# Patient Record
Sex: Female | Born: 1970 | Race: White | Hispanic: No | Marital: Married | Smoking: Never smoker
Health system: Southern US, Community
[De-identification: ages and names within clinical notes are randomized; demographics above are authoritative.]

## PROBLEM LIST (undated history)

## (undated) ENCOUNTER — Inpatient Hospital Stay (HOSPITAL_COMMUNITY): Payer: Self-pay

## (undated) DIAGNOSIS — C50919 Malignant neoplasm of unspecified site of unspecified female breast: Secondary | ICD-10-CM

## (undated) DIAGNOSIS — Z8052 Family history of malignant neoplasm of bladder: Secondary | ICD-10-CM

## (undated) DIAGNOSIS — Z800 Family history of malignant neoplasm of digestive organs: Secondary | ICD-10-CM

## (undated) DIAGNOSIS — Z803 Family history of malignant neoplasm of breast: Secondary | ICD-10-CM

## (undated) DIAGNOSIS — Z808 Family history of malignant neoplasm of other organs or systems: Secondary | ICD-10-CM

## (undated) DIAGNOSIS — Z923 Personal history of irradiation: Secondary | ICD-10-CM

## (undated) HISTORY — DX: Family history of malignant neoplasm of digestive organs: Z80.0

## (undated) HISTORY — PX: OTHER SURGICAL HISTORY: SHX169

## (undated) HISTORY — DX: Family history of malignant neoplasm of other organs or systems: Z80.8

## (undated) HISTORY — DX: Family history of malignant neoplasm of bladder: Z80.52

## (undated) HISTORY — DX: Family history of malignant neoplasm of breast: Z80.3

---

## 2001-07-11 HISTORY — PX: BREAST EXCISIONAL BIOPSY: SUR124

## 2002-08-16 ENCOUNTER — Ambulatory Visit (HOSPITAL_COMMUNITY): Admission: RE | Admit: 2002-08-16 | Discharge: 2002-08-16 | Payer: Self-pay | Admitting: Obstetrics and Gynecology

## 2002-11-01 ENCOUNTER — Inpatient Hospital Stay (HOSPITAL_COMMUNITY): Admission: AD | Admit: 2002-11-01 | Discharge: 2002-11-04 | Payer: Self-pay | Admitting: Obstetrics and Gynecology

## 2002-11-05 ENCOUNTER — Encounter: Admission: RE | Admit: 2002-11-05 | Discharge: 2002-12-05 | Payer: Self-pay | Admitting: Obstetrics and Gynecology

## 2003-01-06 ENCOUNTER — Other Ambulatory Visit: Admission: RE | Admit: 2003-01-06 | Discharge: 2003-01-06 | Payer: Self-pay | Admitting: Obstetrics and Gynecology

## 2004-02-02 ENCOUNTER — Other Ambulatory Visit: Admission: RE | Admit: 2004-02-02 | Discharge: 2004-02-02 | Payer: Self-pay | Admitting: Obstetrics and Gynecology

## 2004-05-22 ENCOUNTER — Emergency Department (HOSPITAL_COMMUNITY): Admission: EM | Admit: 2004-05-22 | Discharge: 2004-05-22 | Payer: Self-pay | Admitting: Emergency Medicine

## 2005-03-04 ENCOUNTER — Other Ambulatory Visit: Admission: RE | Admit: 2005-03-04 | Discharge: 2005-03-04 | Payer: Self-pay | Admitting: Obstetrics and Gynecology

## 2010-06-11 ENCOUNTER — Inpatient Hospital Stay (HOSPITAL_COMMUNITY): Admission: AD | Admit: 2010-06-11 | Discharge: 2010-06-13 | Payer: Self-pay | Admitting: Obstetrics and Gynecology

## 2010-09-20 LAB — CBC
HCT: 34.7 % — ABNORMAL LOW (ref 36.0–46.0)
HCT: 36.8 % (ref 36.0–46.0)
Hemoglobin: 12 g/dL (ref 12.0–15.0)
Hemoglobin: 12.7 g/dL (ref 12.0–15.0)
MCH: 31.9 pg (ref 26.0–34.0)
MCH: 32.2 pg (ref 26.0–34.0)
MCHC: 34.6 g/dL (ref 30.0–36.0)
MCHC: 34.6 g/dL (ref 30.0–36.0)
MCV: 92.3 fL (ref 78.0–100.0)
MCV: 93 fL (ref 78.0–100.0)
Platelets: 175 10*3/uL (ref 150–400)
Platelets: 187 10*3/uL (ref 150–400)
RBC: 3.73 MIL/uL — ABNORMAL LOW (ref 3.87–5.11)
RBC: 3.98 MIL/uL (ref 3.87–5.11)
RDW: 14 % (ref 11.5–15.5)
RDW: 14 % (ref 11.5–15.5)
WBC: 11 10*3/uL — ABNORMAL HIGH (ref 4.0–10.5)
WBC: 8.8 10*3/uL (ref 4.0–10.5)

## 2010-09-20 LAB — RH IMMUNE GLOB WKUP(>/=20WKS)(NOT WOMEN'S HOSP)
Fetal Screen: NEGATIVE
Unit division: 0

## 2010-09-20 LAB — RPR: RPR Ser Ql: NONREACTIVE

## 2011-10-04 ENCOUNTER — Other Ambulatory Visit: Payer: Self-pay | Admitting: Obstetrics and Gynecology

## 2011-10-04 DIAGNOSIS — Z803 Family history of malignant neoplasm of breast: Secondary | ICD-10-CM

## 2011-10-15 ENCOUNTER — Other Ambulatory Visit: Payer: Self-pay

## 2015-05-23 DIAGNOSIS — Q909 Down syndrome, unspecified: Secondary | ICD-10-CM

## 2015-10-24 ENCOUNTER — Encounter (HOSPITAL_COMMUNITY): Payer: Self-pay | Admitting: Certified Nurse Midwife

## 2015-10-24 ENCOUNTER — Inpatient Hospital Stay (HOSPITAL_COMMUNITY): Payer: BLUE CROSS/BLUE SHIELD

## 2015-10-24 ENCOUNTER — Inpatient Hospital Stay (HOSPITAL_COMMUNITY)
Admission: AD | Admit: 2015-10-24 | Discharge: 2015-10-24 | Disposition: A | Discharge status: Home / Self Care | Payer: BLUE CROSS/BLUE SHIELD | Source: Ambulatory Visit | Attending: Obstetrics & Gynecology | Admitting: Obstetrics & Gynecology

## 2015-10-24 DIAGNOSIS — O039 Complete or unspecified spontaneous abortion without complication: Secondary | ICD-10-CM | POA: Insufficient documentation

## 2015-10-24 DIAGNOSIS — N939 Abnormal uterine and vaginal bleeding, unspecified: Secondary | ICD-10-CM | POA: Diagnosis present

## 2015-10-24 DIAGNOSIS — Z3A08 8 weeks gestation of pregnancy: Secondary | ICD-10-CM | POA: Insufficient documentation

## 2015-10-24 DIAGNOSIS — O209 Hemorrhage in early pregnancy, unspecified: Secondary | ICD-10-CM

## 2015-10-24 LAB — CBC
HCT: 36.1 % (ref 36.0–46.0)
Hemoglobin: 12.5 g/dL (ref 12.0–15.0)
MCH: 31 pg (ref 26.0–34.0)
MCHC: 34.6 g/dL (ref 30.0–36.0)
MCV: 89.6 fL (ref 78.0–100.0)
Platelets: 271 10*3/uL (ref 150–400)
RBC: 4.03 MIL/uL (ref 3.87–5.11)
RDW: 12.9 % (ref 11.5–15.5)
WBC: 7.2 10*3/uL (ref 4.0–10.5)

## 2015-10-24 LAB — URINALYSIS, ROUTINE W REFLEX MICROSCOPIC
Bilirubin Urine: NEGATIVE
Glucose, UA: NEGATIVE mg/dL
Ketones, ur: NEGATIVE mg/dL
Leukocytes, UA: NEGATIVE
Nitrite: NEGATIVE
Protein, ur: NEGATIVE mg/dL
Specific Gravity, Urine: 1.01 (ref 1.005–1.030)
pH: 5.5 (ref 5.0–8.0)

## 2015-10-24 LAB — URINE MICROSCOPIC-ADD ON: WBC, UA: NONE SEEN WBC/hpf (ref 0–5)

## 2015-10-24 LAB — POCT PREGNANCY, URINE: Preg Test, Ur: POSITIVE — AB

## 2015-10-24 MED ORDER — MISOPROSTOL 200 MCG PO TABS
600.0000 ug | ORAL_TABLET | Freq: Once | ORAL | Status: AC
  Administered 2015-10-24: 600 ug via ORAL
  Filled 2015-10-24: qty 3

## 2015-10-24 NOTE — MAU Note (Signed)
Pt states she began spotting yesterday and through the night passed small clots with heavy bleeding. Pt states she has very minimal cramping. 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

## 2015-10-24 NOTE — MAU Provider Note (Signed)
History   [id number]   Chief Complaint  Patient presents with  . Vaginal Bleeding    HPI Laura Weiss is a 45 y.o. female  [phone] at [redacted]w[redacted]d IUP here with report of vaginal spotting with the passage of clots, small to medium sized.  Also reports minimal cramping.  +headache.   Prior IUP identified at [employer].  Primary provider is Dr. Ronita Hipps.  Received rhogam in early pregnancy due to bleeding.    No LMP recorded. Patient is pregnant.  OB History  Gravida Para Term Preterm AB SAB TAB Ectopic Multiple Living  4 2 2  1  1   2     # Outcome Date GA Lbr Len/2nd Weight Sex Delivery Anes PTL Lv  4 Current           3 TAB 05/23/15 [redacted]w[redacted]d            Complications: Trisomy 21  2 Term 06/11/10    F Vag-Spont EPI  Y  1 Term 11/02/02    M Vag-Spont EPI N Y      No past medical history on file.  No family history on file.  Social History   Social History  . Marital Status: Married    Spouse Name: N/A  . Number of Children: N/A  . Years of Education: N/A   Social History Main Topics  . Smoking status: Not on file  . Smokeless tobacco: Not on file  . Alcohol Use: Not on file  . Drug Use: Not on file  . Sexual Activity: Not on file   Other Topics Concern  . Not on file   Social History Narrative  . No narrative on file    No Known Allergies  No current facility-administered medications on file prior to encounter.   No current outpatient prescriptions on file prior to encounter.     Review of Systems  Constitutional: Negative for fever.  Genitourinary: Positive for vaginal bleeding and pelvic pain (cramping). Negative for dysuria.  Neurological: Negative for dizziness and light-headedness.  All other systems reviewed and are negative.    Physical Exam   Filed Vitals:   10/24/15 1022  BP: 115/69  Pulse: 87  Temp: 98.1 F (36.7 C)  TempSrc: Oral  Resp: 20  Height: 5\' 7"  (1.702 m)  Weight: 183 lb 9.6 oz (83.28 kg)    Physical Exam  Constitutional: She is  oriented to person, place, and time. She appears well-developed and well-nourished. No distress.  HENT:  Head: Normocephalic.  Neck: Normal range of motion. Neck supple.  Cardiovascular: Normal rate, regular rhythm and normal heart sounds.   Respiratory: Effort normal and breath sounds normal.  GI: Soft. She exhibits no mass. There is no guarding.  Genitourinary: There is bleeding (medium sized clots seen in vaginal vault; tissue teased out cervical os; appears to be products of conception) in the vagina.  Neurological: She is alert and oriented to person, place, and time. She has normal reflexes.  Skin: Skin is warm and dry.    MAU Course  Procedures  Results for orders placed or performed during the hospital encounter of 10/24/15 (from the past 24 hour(s))  Urinalysis, Routine w reflex microscopic (not at [facility])     Status: Abnormal   Collection Time: 10/24/15 10:16 AM  Result Value Ref Range   Color, Urine YELLOW YELLOW   APPearance CLEAR CLEAR   Specific Gravity, Urine 1.010 1.005 - 1.030   pH 5.5 5.0 - 8.0   Glucose, UA  NEGATIVE NEGATIVE mg/dL   Hgb urine dipstick LARGE (A) NEGATIVE   Bilirubin Urine NEGATIVE NEGATIVE   Ketones, ur NEGATIVE NEGATIVE mg/dL   Protein, ur NEGATIVE NEGATIVE mg/dL   Nitrite NEGATIVE NEGATIVE   Leukocytes, UA NEGATIVE NEGATIVE  Urine microscopic-add on     Status: Abnormal   Collection Time: 10/24/15 10:16 AM  Result Value Ref Range   Squamous Epithelial / LPF 0-5 (A) NONE SEEN   WBC, UA NONE SEEN 0 - 5 WBC/hpf   RBC / HPF TOO NUMEROUS TO COUNT 0 - 5 RBC/hpf   Bacteria, UA RARE (A) NONE SEEN  Pregnancy, urine POC     Status: Abnormal   Collection Time: 10/24/15 10:33 AM  Result Value Ref Range   Preg Test, Ur POSITIVE (A) NEGATIVE  CBC     Status: None   Collection Time: 10/24/15 11:27 AM  Result Value Ref Range   WBC 7.2 4.0 - 10.5 K/uL   RBC 4.03 3.87 - 5.11 MIL/uL   Hemoglobin 12.5 12.0 - 15.0 g/dL   HCT 36.1 36.0 - 46.0 %   MCV 89.6  78.0 - 100.0 fL   MCH 31.0 26.0 - 34.0 pg   MCHC 34.6 30.0 - 36.0 g/dL   RDW 12.9 11.5 - 15.5 %   Platelets 271 150 - 400 K/uL   Ultrasound: FINDINGS: Intrauterine gestational sac: None  Yolk sac: None  Embryo: None  Subchorionic hemorrhage: None visualized.  Maternal uterus/adnexae: Diffusely heterogeneous appearance of the myometrium. Nabothian cysts are noted incidentally. Heterogeneous echogenic material is noted within the endometrial canal and cervix. Dominant follicular cyst on the left ovary.  IMPRESSION: Expanded endometrial canal containing heterogeneous echogenic material which extends through the cervix. Sonographic findings are most consistent with passage of blood products. No ectopic pregnancy visualized.  No intrauterine gestational sac identified. Findings are concerning for failed intrauterine pregnancy. Recommend correlation with quantitative beta HCG.   1300 Bleeding has decreased since removal of tissue from os  1305 Dr. Benjie Karvonen called > Reviewed HPI/Exam/labs/ultrasound > give Cytotec 600 mg po and discharge home with follow-up in office with Dr. Ronita Hipps for appt in next 1-2 weeks Assessment and Plan  Spontaneous Abortion  Plan: Discharge home Bleeding precautions reviewed Follow-up with Dr. Ronita Hipps in next 1-2 weeks    Gwen Pounds, CNM 10/24/2015 1:23 PM

## 2015-10-24 NOTE — MAU Note (Signed)
Dr. Benjie Karvonen notified of pt status and orders received to have MAU provider to see pt.

## 2015-10-24 NOTE — Discharge Instructions (Signed)
Miscarriage  A miscarriage is the sudden loss of an unborn baby (fetus) before the 20th week of pregnancy. Most miscarriages happen in the first 3 months of pregnancy. Sometimes, it happens before a woman even knows she is pregnant. A miscarriage is also called a "spontaneous miscarriage" or "early pregnancy loss." Having a miscarriage can be an emotional experience. Talk with your caregiver about any questions you may have about miscarrying, the grieving process, and your future pregnancy plans.  CAUSES    Problems with the fetal chromosomes that make it impossible for the baby to develop normally. Problems with the baby's genes or chromosomes are most often the result of errors that occur, by chance, as the embryo divides and grows. The problems are not inherited from the parents.   Infection of the cervix or uterus.    Hormone problems.    Problems with the cervix, such as having an incompetent cervix. This is when the tissue in the cervix is not strong enough to hold the pregnancy.    Problems with the uterus, such as an abnormally shaped uterus, uterine fibroids, or congenital abnormalities.    Certain medical conditions.    Smoking, drinking alcohol, or taking illegal drugs.    Trauma.   Often, the cause of a miscarriage is unknown.   SYMPTOMS    Vaginal bleeding or spotting, with or without cramps or pain.   Pain or cramping in the abdomen or lower back.   Passing fluid, tissue, or blood clots from the vagina.  DIAGNOSIS   Your caregiver will perform a physical exam. You may also have an ultrasound to confirm the miscarriage. Blood or urine tests may also be ordered.  TREATMENT    Sometimes, treatment is not necessary if you naturally pass all the fetal tissue that was in the uterus. If some of the fetus or placenta remains in the body (incomplete miscarriage), tissue left behind may become infected and must be removed. Usually, a dilation and curettage (D and C) procedure is performed.  During a D and C procedure, the cervix is widened (dilated) and any remaining fetal or placental tissue is gently removed from the uterus.   Antibiotic medicines are prescribed if there is an infection. Other medicines may be given to reduce the size of the uterus (contract) if there is a lot of bleeding.   If you have Rh negative blood and your baby was Rh positive, you will need a Rh immunoglobulin shot. This shot will protect any future baby from having Rh blood problems in future pregnancies.  HOME CARE INSTRUCTIONS    Your caregiver may order bed rest or may allow you to continue light activity. Resume activity as directed by your caregiver.   Have someone help with home and family responsibilities during this time.    Keep track of the number of sanitary pads you use each day and how soaked (saturated) they are. Write down this information.    Do not use tampons. Do not douche or have sexual intercourse until approved by your caregiver.    Only take over-the-counter or prescription medicines for pain or discomfort as directed by your caregiver.    Do not take aspirin. Aspirin can cause bleeding.    Keep all follow-up appointments with your caregiver.    If you or your partner have problems with grieving, talk to your caregiver or seek counseling to help cope with the pregnancy loss. Allow enough time to grieve before trying to get pregnant again.     SEEK IMMEDIATE MEDICAL CARE IF:    You have severe cramps or pain in your back or abdomen.   You have a fever.   You pass large blood clots (walnut-sized or larger) ortissue from your vagina. Save any tissue for your caregiver to inspect.    Your bleeding increases.    You have a thick, bad-smelling vaginal discharge.   You become lightheaded, weak, or you faint.    You have chills.   MAKE SURE YOU:   Understand these instructions.   Will watch your condition.   Will get help right away if you are not doing well or get worse.     This  information is not intended to replace advice given to you by your health care provider. Make sure you discuss any questions you have with your health care provider.     Document Released: 12/21/2000 Document Revised: 10/22/2012 Document Reviewed: 08/16/2011  Elsevier Interactive Patient Education 2016 [employer]vier Inc[employer]

## 2016-03-15 DIAGNOSIS — Z3202 Encounter for pregnancy test, result negative: Secondary | ICD-10-CM | POA: Diagnosis not present

## 2016-03-24 DIAGNOSIS — M79675 Pain in left toe(s): Secondary | ICD-10-CM | POA: Diagnosis not present

## 2016-03-24 DIAGNOSIS — B351 Tinea unguium: Secondary | ICD-10-CM | POA: Diagnosis not present

## 2016-03-24 DIAGNOSIS — M79674 Pain in right toe(s): Secondary | ICD-10-CM | POA: Diagnosis not present

## 2016-04-21 DIAGNOSIS — M79675 Pain in left toe(s): Secondary | ICD-10-CM | POA: Diagnosis not present

## 2016-04-21 DIAGNOSIS — B351 Tinea unguium: Secondary | ICD-10-CM | POA: Diagnosis not present

## 2016-04-21 DIAGNOSIS — M79674 Pain in right toe(s): Secondary | ICD-10-CM | POA: Diagnosis not present

## 2016-06-24 DIAGNOSIS — M79674 Pain in right toe(s): Secondary | ICD-10-CM | POA: Diagnosis not present

## 2016-06-24 DIAGNOSIS — B351 Tinea unguium: Secondary | ICD-10-CM | POA: Diagnosis not present

## 2016-06-24 DIAGNOSIS — M79675 Pain in left toe(s): Secondary | ICD-10-CM | POA: Diagnosis not present

## 2016-08-28 ENCOUNTER — Encounter (HOSPITAL_COMMUNITY): Payer: Self-pay

## 2016-10-26 DIAGNOSIS — Z1151 Encounter for screening for human papillomavirus (HPV): Secondary | ICD-10-CM | POA: Diagnosis not present

## 2016-10-26 DIAGNOSIS — N915 Oligomenorrhea, unspecified: Secondary | ICD-10-CM | POA: Diagnosis not present

## 2016-10-26 DIAGNOSIS — Z6830 Body mass index (BMI) 30.0-30.9, adult: Secondary | ICD-10-CM | POA: Diagnosis not present

## 2016-10-26 DIAGNOSIS — Z8614 Personal history of Methicillin resistant Staphylococcus aureus infection: Secondary | ICD-10-CM | POA: Diagnosis not present

## 2016-10-26 DIAGNOSIS — Z01419 Encounter for gynecological examination (general) (routine) without abnormal findings: Secondary | ICD-10-CM | POA: Diagnosis not present

## 2016-11-15 DIAGNOSIS — Z1231 Encounter for screening mammogram for malignant neoplasm of breast: Secondary | ICD-10-CM | POA: Diagnosis not present

## 2016-11-26 DIAGNOSIS — N911 Secondary amenorrhea: Secondary | ICD-10-CM | POA: Diagnosis not present

## 2016-11-26 DIAGNOSIS — L03211 Cellulitis of face: Secondary | ICD-10-CM | POA: Diagnosis not present

## 2016-12-01 DIAGNOSIS — Z3149 Encounter for other procreative investigation and testing: Secondary | ICD-10-CM | POA: Diagnosis not present

## 2017-01-04 DIAGNOSIS — Z3149 Encounter for other procreative investigation and testing: Secondary | ICD-10-CM | POA: Diagnosis not present

## 2017-04-22 DIAGNOSIS — R300 Dysuria: Secondary | ICD-10-CM | POA: Diagnosis not present

## 2017-06-06 DIAGNOSIS — Z3149 Encounter for other procreative investigation and testing: Secondary | ICD-10-CM | POA: Diagnosis not present

## 2017-07-26 DIAGNOSIS — N970 Female infertility associated with anovulation: Secondary | ICD-10-CM | POA: Diagnosis not present

## 2017-07-28 DIAGNOSIS — N970 Female infertility associated with anovulation: Secondary | ICD-10-CM | POA: Diagnosis not present

## 2017-10-02 DIAGNOSIS — N970 Female infertility associated with anovulation: Secondary | ICD-10-CM | POA: Diagnosis not present

## 2017-11-20 DIAGNOSIS — N970 Female infertility associated with anovulation: Secondary | ICD-10-CM | POA: Diagnosis not present

## 2017-11-23 DIAGNOSIS — Z3189 Encounter for other procreative management: Secondary | ICD-10-CM | POA: Diagnosis not present

## 2017-12-05 DIAGNOSIS — E2839 Other primary ovarian failure: Secondary | ICD-10-CM | POA: Diagnosis not present

## 2017-12-05 DIAGNOSIS — N979 Female infertility, unspecified: Secondary | ICD-10-CM | POA: Diagnosis not present

## 2017-12-05 DIAGNOSIS — Z319 Encounter for procreative management, unspecified: Secondary | ICD-10-CM | POA: Diagnosis not present

## 2017-12-18 DIAGNOSIS — E2839 Other primary ovarian failure: Secondary | ICD-10-CM | POA: Diagnosis not present

## 2017-12-18 DIAGNOSIS — N979 Female infertility, unspecified: Secondary | ICD-10-CM | POA: Diagnosis not present

## 2017-12-21 DIAGNOSIS — N979 Female infertility, unspecified: Secondary | ICD-10-CM | POA: Diagnosis not present

## 2017-12-21 DIAGNOSIS — Z3189 Encounter for other procreative management: Secondary | ICD-10-CM | POA: Diagnosis not present

## 2017-12-26 DIAGNOSIS — Z3189 Encounter for other procreative management: Secondary | ICD-10-CM | POA: Diagnosis not present

## 2018-02-28 DIAGNOSIS — Z1231 Encounter for screening mammogram for malignant neoplasm of breast: Secondary | ICD-10-CM | POA: Diagnosis not present

## 2018-02-28 DIAGNOSIS — Z01419 Encounter for gynecological examination (general) (routine) without abnormal findings: Secondary | ICD-10-CM | POA: Diagnosis not present

## 2018-02-28 DIAGNOSIS — Z6830 Body mass index (BMI) 30.0-30.9, adult: Secondary | ICD-10-CM | POA: Diagnosis not present

## 2018-09-24 DIAGNOSIS — R7309 Other abnormal glucose: Secondary | ICD-10-CM | POA: Diagnosis not present

## 2018-09-24 DIAGNOSIS — Z22322 Carrier or suspected carrier of Methicillin resistant Staphylococcus aureus: Secondary | ICD-10-CM | POA: Diagnosis not present

## 2018-09-24 DIAGNOSIS — R946 Abnormal results of thyroid function studies: Secondary | ICD-10-CM | POA: Diagnosis not present

## 2018-09-24 DIAGNOSIS — Z0000 Encounter for general adult medical examination without abnormal findings: Secondary | ICD-10-CM | POA: Diagnosis not present

## 2018-09-27 DIAGNOSIS — Z0000 Encounter for general adult medical examination without abnormal findings: Secondary | ICD-10-CM | POA: Diagnosis not present

## 2018-09-27 DIAGNOSIS — R7309 Other abnormal glucose: Secondary | ICD-10-CM | POA: Diagnosis not present

## 2018-09-27 DIAGNOSIS — R946 Abnormal results of thyroid function studies: Secondary | ICD-10-CM | POA: Diagnosis not present

## 2018-10-02 DIAGNOSIS — Z0000 Encounter for general adult medical examination without abnormal findings: Secondary | ICD-10-CM | POA: Diagnosis not present

## 2018-11-22 DIAGNOSIS — R197 Diarrhea, unspecified: Secondary | ICD-10-CM | POA: Diagnosis not present

## 2018-11-22 DIAGNOSIS — R109 Unspecified abdominal pain: Secondary | ICD-10-CM | POA: Diagnosis not present

## 2018-11-29 DIAGNOSIS — L0109 Other impetigo: Secondary | ICD-10-CM | POA: Diagnosis not present

## 2018-11-29 DIAGNOSIS — L910 Hypertrophic scar: Secondary | ICD-10-CM | POA: Diagnosis not present

## 2018-12-27 DIAGNOSIS — L910 Hypertrophic scar: Secondary | ICD-10-CM | POA: Diagnosis not present

## 2019-01-23 DIAGNOSIS — H109 Unspecified conjunctivitis: Secondary | ICD-10-CM | POA: Diagnosis not present

## 2019-01-23 DIAGNOSIS — Z7189 Other specified counseling: Secondary | ICD-10-CM | POA: Diagnosis not present

## 2019-03-13 DIAGNOSIS — L910 Hypertrophic scar: Secondary | ICD-10-CM | POA: Diagnosis not present

## 2019-04-02 DIAGNOSIS — Z1231 Encounter for screening mammogram for malignant neoplasm of breast: Secondary | ICD-10-CM | POA: Diagnosis not present

## 2019-04-02 DIAGNOSIS — Z124 Encounter for screening for malignant neoplasm of cervix: Secondary | ICD-10-CM | POA: Diagnosis not present

## 2019-04-02 DIAGNOSIS — Z6830 Body mass index (BMI) 30.0-30.9, adult: Secondary | ICD-10-CM | POA: Diagnosis not present

## 2019-04-02 DIAGNOSIS — Z01419 Encounter for gynecological examination (general) (routine) without abnormal findings: Secondary | ICD-10-CM | POA: Diagnosis not present

## 2020-01-23 DIAGNOSIS — R195 Other fecal abnormalities: Secondary | ICD-10-CM | POA: Diagnosis not present

## 2020-01-23 DIAGNOSIS — N920 Excessive and frequent menstruation with regular cycle: Secondary | ICD-10-CM | POA: Diagnosis not present

## 2020-01-23 DIAGNOSIS — R7309 Other abnormal glucose: Secondary | ICD-10-CM | POA: Diagnosis not present

## 2020-01-23 DIAGNOSIS — Z0000 Encounter for general adult medical examination without abnormal findings: Secondary | ICD-10-CM | POA: Diagnosis not present

## 2020-01-24 DIAGNOSIS — Z0000 Encounter for general adult medical examination without abnormal findings: Secondary | ICD-10-CM | POA: Diagnosis not present

## 2020-01-24 DIAGNOSIS — R7309 Other abnormal glucose: Secondary | ICD-10-CM | POA: Diagnosis not present

## 2020-01-24 DIAGNOSIS — N920 Excessive and frequent menstruation with regular cycle: Secondary | ICD-10-CM | POA: Diagnosis not present

## 2020-04-29 DIAGNOSIS — Z23 Encounter for immunization: Secondary | ICD-10-CM | POA: Diagnosis not present

## 2020-05-25 DIAGNOSIS — Z6833 Body mass index (BMI) 33.0-33.9, adult: Secondary | ICD-10-CM | POA: Diagnosis not present

## 2020-05-25 DIAGNOSIS — Z01419 Encounter for gynecological examination (general) (routine) without abnormal findings: Secondary | ICD-10-CM | POA: Diagnosis not present

## 2020-05-25 DIAGNOSIS — Z1231 Encounter for screening mammogram for malignant neoplasm of breast: Secondary | ICD-10-CM | POA: Diagnosis not present

## 2020-05-25 DIAGNOSIS — Z1151 Encounter for screening for human papillomavirus (HPV): Secondary | ICD-10-CM | POA: Diagnosis not present

## 2020-05-29 ENCOUNTER — Other Ambulatory Visit: Payer: Self-pay | Admitting: Obstetrics and Gynecology

## 2020-05-29 DIAGNOSIS — R928 Other abnormal and inconclusive findings on diagnostic imaging of breast: Secondary | ICD-10-CM

## 2020-06-12 ENCOUNTER — Other Ambulatory Visit: Payer: Self-pay | Admitting: Obstetrics and Gynecology

## 2020-06-12 ENCOUNTER — Ambulatory Visit
Admission: RE | Admit: 2020-06-12 | Discharge: 2020-06-12 | Disposition: A | Discharge status: Home / Self Care | Payer: BC Managed Care – PPO | Source: Ambulatory Visit | Attending: Obstetrics and Gynecology | Admitting: Obstetrics and Gynecology

## 2020-06-12 ENCOUNTER — Other Ambulatory Visit: Payer: Self-pay

## 2020-06-12 DIAGNOSIS — R928 Other abnormal and inconclusive findings on diagnostic imaging of breast: Secondary | ICD-10-CM

## 2020-06-12 DIAGNOSIS — Z803 Family history of malignant neoplasm of breast: Secondary | ICD-10-CM | POA: Diagnosis not present

## 2020-06-12 DIAGNOSIS — R921 Mammographic calcification found on diagnostic imaging of breast: Secondary | ICD-10-CM

## 2020-06-24 ENCOUNTER — Ambulatory Visit
Admission: RE | Admit: 2020-06-24 | Discharge: 2020-06-24 | Disposition: A | Discharge status: Home / Self Care | Payer: BC Managed Care – PPO | Source: Ambulatory Visit | Attending: Obstetrics and Gynecology | Admitting: Obstetrics and Gynecology

## 2020-06-24 ENCOUNTER — Other Ambulatory Visit: Payer: Self-pay

## 2020-06-24 DIAGNOSIS — R921 Mammographic calcification found on diagnostic imaging of breast: Secondary | ICD-10-CM | POA: Diagnosis not present

## 2020-06-24 DIAGNOSIS — D0511 Intraductal carcinoma in situ of right breast: Secondary | ICD-10-CM | POA: Diagnosis not present

## 2020-07-06 ENCOUNTER — Ambulatory Visit: Payer: Self-pay | Admitting: Surgery

## 2020-07-06 DIAGNOSIS — D0511 Intraductal carcinoma in situ of right breast: Secondary | ICD-10-CM

## 2020-07-06 NOTE — H&P (View-Only) (Signed)
Laura Weiss Appointment: 07/06/2020 3:30 PM Location: [facility] Patient #: [id number] DOB: 1971-04-03 Married / Language: Laura Weiss / Race: White Female  History of Present Illness [name] A. Shafer [name]; 07/06/2020 4:53 PM) Patient words: 49 year old female presenting with abnormal mammogram. She had a cluster of right breast microcalcifications around 12:00. 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 This is about 5 mm in maximal diameter. Denies any history of breast pain nipple discharge or change the appearance of either breast. Strong family history mother with breast cancer in her 29s and a paternal grandmother in her 73s. She has no recent genetic testing.         CLINICAL DATA: 49 year old with a personal history of excisional biopsy from the RIGHT breast in 2003 demonstrating atypical ductal hyperplasia and a family history of breast cancer in her mother at age 17. Recall from screening mammography, calcifications involving the UPPER RIGHT breast at POSTERIOR depth.  EXAM: DIGITAL DIAGNOSTIC RIGHT MAMMOGRAM WITH CAD  COMPARISON: Previous exam(s).  ACR Breast Density Category c: The breast tissue is heterogeneously dense, which may obscure small masses.  FINDINGS: Digital 2D spot magnification CC and mediolateral views of the RIGHT breast calcifications and a digital 2D full field mediolateral view of the RIGHT breast were obtained. The full field mediolateral image was processed with CAD.  Spot magnification images confirm a 5 mm group of fine pleomorphic calcifications in the UPPER breast at POSTERIOR depth.  IMPRESSION: Indeterminate 5 mm group of fine pleomorphic calcifications involving the UPPER RIGHT breast at POSTERIOR depth.  RECOMMENDATION: Stereotactic tomosynthesis core needle biopsy of the RIGHT breast calcifications.  The stereotactic tomosynthesis core needle biopsy procedure  was discussed with the patient and her questions were answered. She wishes to proceed and the biopsy has been scheduled at her convenience.  I have discussed the findings and recommendations with the patient.  BI-RADS CATEGORY 4: Suspicious.   Electronically Signed By: Evangeline Dakin M.D. On: 06/12/2020 16:00            ADDITIONAL INFORMATION: Patient presents for evaluation of abnormal right mammogram. She is known to have a cluster of right breast microcalcifications on 12:00 biopsy proven to be DCIS. History of previous right breast biopsy for atypical ductal hyperplasia in the past. Family History of Breast Cancer with Her Mother Diagnosed in Her 44s and a Paternal Grandmother in Her 66s. She Has Not Had Any Recent Genetic Testing. Denies Any History of Breast Pain Nipple Discharge or Breast Mass.              PROGNOSTIC INDICATORS Results: IMMUNOHISTOCHEMICAL AND MORPHOMETRIC ANALYSIS PERFORMED MANUALLY Estrogen Receptor: 90%, POSITIVE, WEAK-MODERATE STAINING INTENSITY Progesterone Receptor: 2%, POSITIVE, MODERATE STAINING INTENSITY REFERENCE RANGE ESTROGEN RECEPTOR NEGATIVE 0% POSITIVE =>1% REFERENCE RANGE PROGESTERONE RECEPTOR NEGATIVE 0% POSITIVE =>1% All controls stained appropriately Jaquita Folds MD Pathologist, Electronic Signature ( Signed 06/26/2020) FINAL DIAGNOSIS Diagnosis Breast, right, needle core biopsy, upper posterior depth near 12 o'clock - DUCTAL CARCINOMA IN SITU WITH NECROSIS AND CALCIFICATIONS. - SEE MICROSCOPIC DESCRIPTION. 1 of 2 FINAL for Laura, Weiss B [phone]) Microscopic Comment The DCIS is intermediate to high grade. Estrogen and progesterone receptors will be performed. Dr. Tresa Moore agrees. Called to the [facility] on 06/25/20. (JDP:kh 06/25/20) [name] Pathologist, Electronic Signature (Case signed 06/25/2020) Specimen Gross and Clinical Information Specimen Comment CIT:  42mins, TIF: 10:21am, Screening detected indeterminate 50mm group of calcifications, remote excisional biopsy RT breast in 2003 for ADH  Specimen(s).  The patient is a 49 year old female.   Past Surgical History [name], CMA; 07/06/2020 3:48 PM) Breast Biopsy Right.  Diagnostic Studies History Sharyn Lull R. Rolena Infante, CMA; 07/06/2020 3:48 PM) Colonoscopy never Mammogram within last year Pap Smear 1-5 years ago  Allergies [name], CMA; 07/06/2020 3:48 PM) No Known Drug Allergies [07/06/2020]:  Medication History Sharyn Lull R. Rolena Infante, CMA; 07/06/2020 3:48 PM) No Current Medications Medications Reconciled  Social History [name], CMA; 07/06/2020 3:48 PM) Alcohol use Moderate alcohol use. Caffeine use Coffee. No drug use Tobacco use Never smoker.  Family History Sharyn Lull R. Rolena Infante, CMA; 07/06/2020 3:48 PM) Breast Cancer Mother. Cancer Mother. Malignant Neoplasm Of Pancreas Mother.  Pregnancy / Birth History Sharyn Lull R. Rolena Infante, CMA; 07/06/2020 3:48 PM) Age at menarche 26 years. Contraceptive History Oral contraceptives. Gravida 4 Length (months) of breastfeeding 12-24 Maternal age 63-30 Para 2 Regular periods  Other Problems Sharyn Lull R. Rolena Infante, CMA; 07/06/2020 3:48 PM) No pertinent past medical history     Review of Systems ([name]; 07/06/2020 3:48 PM) General Not Present- Appetite Loss, Chills, Fatigue, Fever, Night Sweats, Weight Gain and Weight Loss. Skin Not Present- Change in Wart/Mole, Dryness, Hives, Jaundice, New Lesions, Non-Healing Wounds, Rash and Ulcer. HEENT Present- Wears glasses/contact lenses. Not Present- Earache, Hearing Loss, Hoarseness, Nose Bleed, Oral Ulcers, Ringing in the Ears, Seasonal Allergies, Sinus Pain, Sore Throat, Visual Disturbances and Yellow Eyes. Breast Not Present- Breast Mass, Breast Pain, Nipple Discharge and Skin Changes. Cardiovascular Not Present- Chest Pain,  Difficulty Breathing Lying Down, Leg Cramps, Palpitations, Rapid Heart Rate, Shortness of Breath and Swelling of Extremities. Gastrointestinal Not Present- Abdominal Pain, Bloating, Bloody Stool, Change in Bowel Habits, Chronic diarrhea, Constipation, Difficulty Swallowing, Excessive gas, Gets full quickly at meals, Hemorrhoids, Indigestion, Nausea, Rectal Pain and Vomiting. Female Genitourinary Not Present- Frequency, Nocturia, Painful Urination, Pelvic Pain and Urgency. Musculoskeletal Not Present- Back Pain, Joint Pain, Joint Stiffness, Muscle Pain, Muscle Weakness and Swelling of Extremities. Neurological Not Present- Decreased Memory, Fainting, Headaches, Numbness, Seizures, Tingling, Tremor, Trouble walking and Weakness. Psychiatric Not Present- Anxiety, Bipolar, Change in Sleep Pattern, Depression, Fearful and Frequent crying. Endocrine Not Present- Cold Intolerance, Excessive Hunger, Hair Changes, Heat Intolerance, Hot flashes and New Diabetes. Hematology Not Present- Blood Thinners, Easy Bruising, Excessive bleeding, Gland problems, HIV and Persistent Infections.  Vitals [employer] R. Brooks CMA; 07/06/2020 3:48 PM) 07/06/2020 3:48 PM Weight: 210 lb Height: 67in Body Surface Area: 2.06 m Body Mass Index: 32.89 kg/m  Pulse: 103 (Regular)  BP: 124/78(Sitting, Left Arm, Standard)        Physical Exam (Faithann Natal A. Shamel Galyean MD; 07/06/2020 4:49 PM)  General Mental Status-Alert. General Appearance-Consistent with stated age. Hydration-Well hydrated. Voice-Normal.  Head and Neck Head-normocephalic, atraumatic with no lesions or palpable masses. Trachea-midline. Thyroid Gland Characteristics - normal size and consistency.  Eye Eyeball - Bilateral-Extraocular movements intact. Sclera/Conjunctiva - Bilateral-No scleral icterus.  Chest and Lung Exam Chest and lung exam reveals -quiet, even and easy respiratory effort with no use of accessory muscles and  on auscultation, normal breath sounds, no adventitious sounds and normal vocal resonance. Inspection Chest Wall - Normal. Back - normal.  Breast Breast - Left-Symmetric, Non Tender, No Biopsy scars, no Dimpling - Left, No Inflammation, No Lumpectomy scars, No Mastectomy scars, No Peau d' Orange. Breast - Right-Symmetric, Non Tender, No Biopsy scars, no Dimpling - Right, No Inflammation, No Lumpectomy scars, No Mastectomy scars, No Peau d' Orange. Breast Lump-No Palpable Breast Mass.  Cardiovascular  Cardiovascular examination reveals -normal heart sounds, regular rate and rhythm with no murmurs and normal pedal pulses bilaterally.  Abdomen Inspection Inspection of the abdomen reveals - No Hernias. Skin - Scar - no surgical scars. Palpation/Percussion Palpation and Percussion of the abdomen reveal - Soft, Non Tender, No Rebound tenderness, No Rigidity (guarding) and No hepatosplenomegaly. Auscultation Auscultation of the abdomen reveals - Bowel sounds normal.  Neurologic Neurologic evaluation reveals -alert and oriented x 3 with no impairment of recent or remote memory. Mental Status-Normal.  Musculoskeletal Normal Exam - Left-Upper Extremity Strength Normal and Lower Extremity Strength Normal. Normal Exam - Right-Upper Extremity Strength Normal and Lower Extremity Strength Normal.  Lymphatic Head & Neck  General Head & Neck Lymphatics: Bilateral - Description - Normal. Axillary  General Axillary Region: Bilateral - Description - Normal. Tenderness - Non Tender. Femoral & Inguinal  Generalized Femoral & Inguinal Lymphatics: Bilateral - Description - Normal. Tenderness - Non Tender.    Assessment & Plan (Zyquan Crotty A. Mariluz Crespo MD; 07/06/2020 4:52 PM)  BREAST NEOPLASM, TIS (DCIS), RIGHT (D05.11) Impression: Patient desires seed localized lumpectomy. Referral to medical and radiation oncology Her for genetics Discussed significant family history since impact they  have all decision for treatment and/or surgery. Discussed risk reducing mastectomy and reconstruction. At this point time she seems content to proceed with a right breast lumpectomy as long as genetic testing does not reveal a significant mutation. Risk of lumpectomy include bleeding, infection, seroma, more surgery, use of seed/wire, wound care, cosmetic deformity and the need for other treatments, death , blood clots, death. Pt agrees to proceed.  Total time 45 minutes  Current Plans We discussed the staging and pathophysiology of breast cancer. We discussed all of the different options for treatment for breast cancer including surgery, chemotherapy, radiation therapy, Herceptin, and antiestrogen therapy. We discussed a sentinel lymph node biopsy as she does not appear to having lymph node involvement right now. We discussed the performance of that with injection of radioactive tracer and blue dye. We discussed that she would have an incision underneath her axillary hairline. We discussed that there is a bout a 10-20% chance of having a positive node with a sentinel lymph node biopsy and we will await the permanent pathology to make any other first further decisions in terms of her treatment. One of these options might be to return to the operating room to perform an axillary lymph node dissection. We discussed about a 1-2% risk lifetime of chronic shoulder pain as well as lymphedema associated with a sentinel lymph node biopsy. We discussed the options for treatment of the breast cancer which included lumpectomy versus a mastectomy. We discussed the performance of the lumpectomy with a wire placement. We discussed a 10-20% chance of a positive margin requiring reexcision in the operating room. We also discussed that she may need radiation therapy or antiestrogen therapy or both if she undergoes lumpectomy. We discussed the mastectomy and the postoperative care for that as well. We discussed that there is  no difference in her survival whether she undergoes lumpectomy with radiation therapy or antiestrogen therapy versus a mastectomy. There is a slight difference in the local recurrence rate being 3-5% with lumpectomy and about 1% with a mastectomy. We discussed the risks of operation including bleeding, infection, possible reoperation. She understands her further therapy will be based on what her stages at the time of her operation.  Pt Education - flb breast cancer surgery: discussed with patient and provided information. Pt Education - Pamphlet  Given - Breast Biopsy: discussed with patient and provided information.

## 2020-07-06 NOTE — H&P (Signed)
Laura Weiss Appointment: 07/06/2020 3:30 PM Location: [facility] Patient #: [id number] DOB: 1971-04-03 Married / Language: [name] / Race: White Female  History of Present Illness [name] A. [name] MD; 07/06/2020 4:53 PM) Patient words: 49 year old female presenting with abnormal mammogram. She had a cluster of right breast microcalcifications around 12:00. 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 This is about 5 mm in maximal diameter. Denies any history of breast pain nipple discharge or change the appearance of either breast. Strong family history mother with breast cancer in her 29s and a paternal grandmother in her 73s. She has no recent genetic testing.         CLINICAL DATA: 49 year old with a personal history of excisional biopsy from the RIGHT breast in 2003 demonstrating atypical ductal hyperplasia and a family history of breast cancer in her mother at age 17. Recall from screening mammography, calcifications involving the UPPER RIGHT breast at POSTERIOR depth.  EXAM: DIGITAL DIAGNOSTIC RIGHT MAMMOGRAM WITH CAD  COMPARISON: Previous exam(s).  ACR Breast Density Category c: The breast tissue is heterogeneously dense, which may obscure small masses.  FINDINGS: Digital 2D spot magnification CC and mediolateral views of the RIGHT breast calcifications and a digital 2D full field mediolateral view of the RIGHT breast were obtained. The full field mediolateral image was processed with CAD.  Spot magnification images confirm a 5 mm group of fine pleomorphic calcifications in the UPPER breast at POSTERIOR depth.  IMPRESSION: Indeterminate 5 mm group of fine pleomorphic calcifications involving the UPPER RIGHT breast at POSTERIOR depth.  RECOMMENDATION: Stereotactic tomosynthesis core needle biopsy of the RIGHT breast calcifications.  The stereotactic tomosynthesis core needle biopsy procedure  was discussed with the patient and her questions were answered. She wishes to proceed and the biopsy has been scheduled at her convenience.  I have discussed the findings and recommendations with the patient.  BI-RADS CATEGORY 4: Suspicious.   Electronically Signed By: Laura Weiss M.D. On: 06/12/2020 16:00            ADDITIONAL INFORMATION: Patient presents for evaluation of abnormal right mammogram. She is known to have a cluster of right breast microcalcifications on 12:00 biopsy proven to be DCIS. History of previous right breast biopsy for atypical ductal hyperplasia in the past. Family History of Breast Cancer with Her Mother Diagnosed in Her 44s and a Paternal Grandmother in Her 66s. She Has Not Had Any Recent Genetic Testing. Denies Any History of Breast Pain Nipple Discharge or Breast Mass.              PROGNOSTIC INDICATORS Results: IMMUNOHISTOCHEMICAL AND MORPHOMETRIC ANALYSIS PERFORMED MANUALLY Estrogen Receptor: 90%, POSITIVE, WEAK-MODERATE STAINING INTENSITY Progesterone Receptor: 2%, POSITIVE, MODERATE STAINING INTENSITY REFERENCE RANGE ESTROGEN RECEPTOR NEGATIVE 0% POSITIVE =>1% REFERENCE RANGE PROGESTERONE RECEPTOR NEGATIVE 0% POSITIVE =>1% All controls stained appropriately Jaquita Folds MD Pathologist, Electronic Signature ( Signed 06/26/2020) FINAL DIAGNOSIS Diagnosis Breast, right, needle core biopsy, upper posterior depth near 12 o'clock - DUCTAL CARCINOMA IN SITU WITH NECROSIS AND CALCIFICATIONS. - SEE MICROSCOPIC DESCRIPTION. 1 of 2 FINAL for Laura, Weiss B [phone]) Microscopic Comment The DCIS is intermediate to high grade. Estrogen and progesterone receptors will be performed. Dr. Tresa Moore agrees. Called to the [facility] on 06/25/20. (JDP:kh 06/25/20) Laura Laws MD Pathologist, Electronic Signature (Case signed 06/25/2020) Specimen Gross and Clinical Information Specimen Comment CIT:  42mins, TIF: 10:21am, Screening detected indeterminate 50mm group of calcifications, remote excisional biopsy RT breast in 2003 for AD[facility]  Specimen(s).  The patient is a 49 year old female.   Past Surgical History (Laura Weiss, CMA; 07/06/2020 3:48 PM) Breast Biopsy Right.  Diagnostic Studies History (Laura Weiss, CMA; 07/06/2020 3:48 PM) Colonoscopy never Mammogram within last year Pap Smear 1-5 years ago  Allergies (Laura Weiss, CMA; 07/06/2020 3:48 PM) No Known Drug Allergies [07/06/2020]:  Medication History (Laura Weiss, CMA; 07/06/2020 3:48 PM) No Current Medications Medications Reconciled  Social History (Laura Weiss, CMA; 07/06/2020 3:48 PM) Alcohol use Moderate alcohol use. Caffeine use Coffee. No drug use Tobacco use Never smoker.  Family History (Laura Weiss, CMA; 07/06/2020 3:48 PM) Breast Cancer Mother. Cancer Mother. Malignant Neoplasm Of Pancreas Mother.  Pregnancy / Birth History (Laura Weiss, CMA; 07/06/2020 3:48 PM) Age at menarche 13 years. Contraceptive History Oral contraceptives. Gravida 4 Length (months) of breastfeeding 12-24 Maternal age 26-30 Para 2 Regular periods  Other Problems (Laura Weiss, CMA; 07/06/2020 3:48 PM) No pertinent past medical history     Review of Systems (Laura Weiss CMA; 07/06/2020 3:48 PM) General Not Present- Appetite Loss, Chills, Fatigue, Fever, Night Sweats, Weight Gain and Weight Loss. Skin Not Present- Change in Wart/Mole, Dryness, Hives, Jaundice, New Lesions, Non-Healing Wounds, Rash and Ulcer. HEENT Present- Wears glasses/contact lenses. Not Present- Earache, Hearing Loss, Hoarseness, Nose Bleed, Oral Ulcers, Ringing in the Ears, Seasonal Allergies, Sinus Pain, Sore Throat, Visual Disturbances and Yellow Eyes. Breast Not Present- Breast Mass, Breast Pain, Nipple Discharge and Skin Changes. Cardiovascular Not Present- Chest Pain,  Difficulty Breathing Lying Down, Leg Cramps, Palpitations, Rapid Heart Rate, Shortness of Breath and Swelling of Extremities. Gastrointestinal Not Present- Abdominal Pain, Bloating, Bloody Stool, Change in Bowel Habits, Chronic diarrhea, Constipation, Difficulty Swallowing, Excessive gas, Gets full quickly at meals, Hemorrhoids, Indigestion, Nausea, Rectal Pain and Vomiting. Female Genitourinary Not Present- Frequency, Nocturia, Painful Urination, Pelvic Pain and Urgency. Musculoskeletal Not Present- Back Pain, Joint Pain, Joint Stiffness, Muscle Pain, Muscle Weakness and Swelling of Extremities. Neurological Not Present- Decreased Memory, Fainting, Headaches, Numbness, Seizures, Tingling, Tremor, Trouble walking and Weakness. Psychiatric Not Present- Anxiety, Bipolar, Change in Sleep Pattern, Depression, Fearful and Frequent crying. Endocrine Not Present- Cold Intolerance, Excessive Hunger, Hair Changes, Heat Intolerance, Hot flashes and New Diabetes. Hematology Not Present- Blood Thinners, Easy Bruising, Excessive bleeding, Gland problems, HIV and Persistent Infections.  Vitals (Laura Weiss CMA; 07/06/2020 3:48 PM) 07/06/2020 3:48 PM Weight: 210 lb Height: 67in Body Surface Area: 2.06 m Body Mass Index: 32.89 kg/m  Pulse: 103 (Regular)  BP: 124/78(Sitting, Left Arm, Standard)        Physical Exam (Leronda Lewers A. Ashiyah Pavlak MD; 07/06/2020 4:49 PM)  General Mental Status-Alert. General Appearance-Consistent with stated age. Hydration-Well hydrated. Voice-Normal.  Head and Neck Head-normocephalic, atraumatic with no lesions or palpable masses. Trachea-midline. Thyroid Gland Characteristics - normal size and consistency.  Eye Eyeball - Bilateral-Extraocular movements intact. Sclera/Conjunctiva - Bilateral-No scleral icterus.  Chest and Lung Exam Chest and lung exam reveals -quiet, even and easy respiratory effort with no use of accessory muscles and  on auscultation, normal breath sounds, no adventitious sounds and normal vocal resonance. Inspection Chest Wall - Normal. Back - normal.  Breast Breast - Left-Symmetric, Non Tender, No Biopsy scars, no Dimpling - Left, No Inflammation, No Lumpectomy scars, No Mastectomy scars, No Peau d' Orange. Breast - Right-Symmetric, Non Tender, No Biopsy scars, no Dimpling - Right, No Inflammation, No Lumpectomy scars, No Mastectomy scars, No Peau d' Orange. Breast Lump-No Palpable Breast Mass.  Cardiovascular   Cardiovascular examination reveals -normal heart sounds, regular rate and rhythm with no murmurs and normal pedal pulses bilaterally.  Abdomen Inspection Inspection of the abdomen reveals - No Hernias. Skin - Scar - no surgical scars. Palpation/Percussion Palpation and Percussion of the abdomen reveal - Soft, Non Tender, No Rebound tenderness, No Rigidity (guarding) and No hepatosplenomegaly. Auscultation Auscultation of the abdomen reveals - Bowel sounds normal.  Neurologic Neurologic evaluation reveals -alert and oriented x 3 with no impairment of recent or remote memory. Mental Status-Normal.  Musculoskeletal Normal Exam - Left-Upper Extremity Strength Normal and Lower Extremity Strength Normal. Normal Exam - Right-Upper Extremity Strength Normal and Lower Extremity Strength Normal.  Lymphatic Head & Neck  General Head & Neck Lymphatics: Bilateral - Description - Normal. Axillary  General Axillary Region: Bilateral - Description - Normal. Tenderness - Non Tender. Femoral & Inguinal  Generalized Femoral & Inguinal Lymphatics: Bilateral - Description - Normal. Tenderness - Non Tender.    Assessment & Plan (Devian Bartolomei A. Adorian Gwynne MD; 07/06/2020 4:52 PM)  BREAST NEOPLASM, TIS (DCIS), RIGHT (D05.11) Impression: Patient desires seed localized lumpectomy. Referral to medical and radiation oncology Her for genetics Discussed significant family history since impact they  have all decision for treatment and/or surgery. Discussed risk reducing mastectomy and reconstruction. At this point time she seems content to proceed with a right breast lumpectomy as long as genetic testing does not reveal a significant mutation. Risk of lumpectomy include bleeding, infection, seroma, more surgery, use of seed/wire, wound care, cosmetic deformity and the need for other treatments, death , blood clots, death. Pt agrees to proceed.  Total time 45 minutes  Current Plans We discussed the staging and pathophysiology of breast cancer. We discussed all of the different options for treatment for breast cancer including surgery, chemotherapy, radiation therapy, Herceptin, and antiestrogen therapy. We discussed a sentinel lymph node biopsy as she does not appear to having lymph node involvement right now. We discussed the performance of that with injection of radioactive tracer and blue dye. We discussed that she would have an incision underneath her axillary hairline. We discussed that there is a bout a 10-20% chance of having a positive node with a sentinel lymph node biopsy and we will await the permanent pathology to make any other first further decisions in terms of her treatment. One of these options might be to return to the operating room to perform an axillary lymph node dissection. We discussed about a 1-2% risk lifetime of chronic shoulder pain as well as lymphedema associated with a sentinel lymph node biopsy. We discussed the options for treatment of the breast cancer which included lumpectomy versus a mastectomy. We discussed the performance of the lumpectomy with a wire placement. We discussed a 10-20% chance of a positive margin requiring reexcision in the operating room. We also discussed that she may need radiation therapy or antiestrogen therapy or both if she undergoes lumpectomy. We discussed the mastectomy and the postoperative care for that as well. We discussed that there is  no difference in her survival whether she undergoes lumpectomy with radiation therapy or antiestrogen therapy versus a mastectomy. There is a slight difference in the local recurrence rate being 3-5% with lumpectomy and about 1% with a mastectomy. We discussed the risks of operation including bleeding, infection, possible reoperation. She understands her further therapy will be based on what her stages at the time of her operation.  Pt Education - flb breast cancer surgery: discussed with patient and provided information. Pt Education - Pamphlet  Given - Breast Biopsy: discussed with patient and provided information. 

## 2020-07-08 ENCOUNTER — Telehealth: Payer: Self-pay | Admitting: Hematology and Oncology

## 2020-07-08 ENCOUNTER — Telehealth: Payer: Self-pay | Admitting: *Deleted

## 2020-07-08 NOTE — Telephone Encounter (Signed)
Received referrals from Dr. Luisa Hart for genetics and med onc for breast cancer. Laura Weiss returned my call and has been scheduled to see Dr. Pamelia Hoit on 1/11 at 1pm and [name] for genetics on 1/4 at [facility]. Pt aware to arrive 15 minutes early. For both appts.

## 2020-07-08 NOTE — Telephone Encounter (Signed)
LVM for call back to schedule appointment with Dr. Squire 

## 2020-07-09 ENCOUNTER — Other Ambulatory Visit: Payer: Self-pay | Admitting: Surgery

## 2020-07-09 DIAGNOSIS — D0511 Intraductal carcinoma in situ of right breast: Secondary | ICD-10-CM

## 2020-07-13 ENCOUNTER — Other Ambulatory Visit: Payer: Self-pay | Admitting: Genetic Counselor

## 2020-07-13 DIAGNOSIS — D0511 Intraductal carcinoma in situ of right breast: Secondary | ICD-10-CM

## 2020-07-14 ENCOUNTER — Inpatient Hospital Stay: Payer: BC Managed Care – PPO | Attending: Hematology and Oncology | Admitting: Genetic Counselor

## 2020-07-14 ENCOUNTER — Other Ambulatory Visit: Payer: Self-pay

## 2020-07-14 ENCOUNTER — Inpatient Hospital Stay: Payer: BC Managed Care – PPO

## 2020-07-14 ENCOUNTER — Encounter: Payer: Self-pay | Admitting: Genetic Counselor

## 2020-07-14 DIAGNOSIS — D0511 Intraductal carcinoma in situ of right breast: Secondary | ICD-10-CM

## 2020-07-14 DIAGNOSIS — Z800 Family history of malignant neoplasm of digestive organs: Secondary | ICD-10-CM | POA: Insufficient documentation

## 2020-07-14 DIAGNOSIS — Z803 Family history of malignant neoplasm of breast: Secondary | ICD-10-CM | POA: Diagnosis not present

## 2020-07-14 DIAGNOSIS — Z8052 Family history of malignant neoplasm of bladder: Secondary | ICD-10-CM | POA: Diagnosis not present

## 2020-07-14 DIAGNOSIS — Z808 Family history of malignant neoplasm of other organs or systems: Secondary | ICD-10-CM | POA: Insufficient documentation

## 2020-07-14 DIAGNOSIS — Z79899 Other long term (current) drug therapy: Secondary | ICD-10-CM | POA: Insufficient documentation

## 2020-07-14 LAB — GENETIC SCREENING ORDER

## 2020-07-14 NOTE — Progress Notes (Signed)
REFERRING PROVIDER: Erroll Luna, MD [address] [place],  [place] [address]  PRIMARY PROVIDER:  [name], DO  PRIMARY REASON FOR VISIT:  1. Ductal carcinoma in situ of right breast   2. Family history of breast cancer   3. Family history of bladder cancer   4. Family history of pancreatic cancer   5. Family history of melanoma   6. Family history of rectal cancer      I connected with Ms. Collar on 07/14/2020 at 9:00 am EDT by video conference and verified that I am speaking with the correct person using two identifiers.   Patient location: Home Provider location: [facility] office  HISTORY OF PRESENT ILLNESS:   Ms. Mastrogiovanni, a 50 y.o. female, was seen for a [facility] cancer genetics consultation at the request of Dr. Brantley Stage due to a personal and family history of cancer.  Ms. Gilles presents to clinic today to discuss the possibility of a hereditary predisposition to cancer, genetic testing, and to further clarify her future cancer risks, as well as potential cancer risks for family members.   In December of 2021, at the age of 15, Ms. 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 The treatment plan includes surgery (scheduled for 08/04/2020).   RISK FACTORS:  Menarche was at age 62.  First live birth at age 45.  OCP use for approximately 22 years.  Ovaries intact: yes.  Hysterectomy: no.  Menopausal status: premenopausal.  HRT use: 0 years. Colonoscopy: no. Mammogram within the last year: yes. Number of breast biopsies: 2. Any excessive radiation exposure in the past: no   Past Medical History:  Diagnosis Date  . Family history of bladder cancer   . Family history of breast cancer   . Family history of melanoma   . Family history of pancreatic cancer   . Family history of rectal cancer     Social History   Socioeconomic History  . Marital status: Married    Spouse name: Not on file  . Number of children: Not  on file  . Years of education: Not on file  . Highest education level: Not on file  Occupational History  . Not on file  Tobacco Use  . Smoking status: Not on file  . Smokeless tobacco: Not on file  Substance and Sexual Activity  . Alcohol use: Not on file  . Drug use: Not on file  . Sexual activity: Not on file  Other Topics Concern  . Not on file  Social History Narrative  . Not on file   Social Determinants of Health   Financial Resource Strain: Not on file  Food Insecurity: Not on file  Transportation Needs: Not on file  Physical Activity: Not on file  Stress: Not on file  Social Connections: Not on file     FAMILY HISTORY:  We obtained a detailed, 4-generation family history.  Significant diagnoses are listed below: Family History  Problem Relation Age of Onset  . Breast cancer Mother 12  . Bladder Cancer Mother 79  . Pancreatic cancer Mother 65  . Breast cancer Paternal Grandmother        dx 42s/80s  . Melanoma Maternal Grandfather        multiple dx 51s, sun exposure  . Rectal cancer Other        dx late 27s, father's cousin   Ms. Holdren has one son (age 45) and one daughter (age 61). She has  one sister (age 68) who has one daughter. None of these family members have had cancer.  Ms. Bringle mother died at age 41 and had a history of breast cancer (diagnosed age 79), bladder cancer (diagnosed age 83), and pancreatic cancer (diagnosed age 49). There are no maternal aunts or uncles. Her maternal grandmother died at age 52. Her maternal grandfather died at age 64 and had a history of multiple melanomas beginning in his 63s. Notably he did have a lot of sun exposure throughout his life.  Ms. Degracia father is 24 and has not had cancer. There are no paternal aunts or uncles. Her paternal grandmother died at age 26 and had breast cancer diagnosed in her 3s or 8s. Her paternal grandfather died in his 27s of appendicitis. Her father had a maternal cousin who died  from rectal cancer in her late 29s.   Patient's maternal ancestors are of [place] descent, and paternal ancestors are of [place] descent. There is no reported Ashkenazi Jewish ancestry. There is no known consanguinity.  GENETIC COUNSELING ASSESSMENT: Ms. Partch is a 50 y.o. female with a personal and family history of breast cancer and a family history of bladder cancer, pancreatic cancer, melanoma, and rectal cancer, which is somewhat suggestive of a hereditary cancer syndrome and predisposition to cancer. We, therefore, discussed and recommended the following at today's visit.   DISCUSSION: We discussed that approximately 5-10% of breast cancer is hereditary, with most cases associated with the BRCA1 and BRCA2 genes. There are other genes that can be associated with hereditary breast cancer syndromes. These include ATM, CHEK2, PALB2, etc. We discussed that testing is beneficial for several reasons, including knowing about other cancer risks, identifying potential screening and risk-reduction options that may be appropriate, and to understand if other family members could be at risk for cancer and allow them to undergo genetic testing.   We reviewed the characteristics, features and inheritance patterns of hereditary cancer syndromes. We also discussed genetic testing, including the appropriate family members to test, the process of testing, insurance coverage and turn-around-time for results. We discussed the implications of a negative, positive and/or variant of uncertain significant result. In order to get genetic test results in a timely manner so that Ms. Ratterree can use these genetic test results for surgical decisions, we recommended Ms. Debruin pursue genetic testing for the [facility] Breast Cancer STAT panel. Once complete, we recommend Ms. Murrill pursue reflex genetic testing to the Common Hereditary Cancers panel.   The Breast Cancer STAT Panel offered by [facility] includes sequencing and  deletion/duplication analysis for the following 9 genes:  ATM, BRCA1, BRCA2, CDH1, CHEK2, PALB2, PTEN, STK11 and TP53. The Common Hereditary Cancers Panel offered by [facility] includes sequencing and/or deletion duplication testing of the following 48 genes: APC, ATM, AXIN2, BARD1, BMPR1A, BRCA1, BRCA2, BRIP1, CDH1, CDK4, CDKN2A (p14ARF), CDKN2A (p16INK4a), CHEK2, CTNNA1, DICER1, EPCAM (Deletion/duplication testing only), GREM1 (promoter region deletion/duplication testing only), KIT, MEN1, MLH1, MSH2, MSH3, MSH6, MUTYH, NBN, NF1, NTHL1, PALB2, PDGFRA, PMS2, POLD1, POLE, PTEN, RAD50, RAD51C, RAD51D, RNF43, SDHB, SDHC, SDHD, SMAD4, SMARCA4. STK11, TP53, TSC1, TSC2, and VHL.  The following genes were evaluated for sequence changes only: SDHA and HOXB13 c.251G>A variant only.   Based on Ms. Mayse's personal and family history of cancer, she meets medical criteria for genetic testing. Despite that she meets criteria, she may still have an out of pocket cost.   PLAN: After considering the risks, benefits, and limitations, Ms. Oldaker provided informed consent to pursue genetic  testing and the blood sample was sent to [facility] for analysis of the Breast Cancer STAT panel + Common Hereditary Cancers panel. Results should be available within approximately one-two weeks' time, at which point they will be disclosed by telephone to Ms. Willaims, as will any additional recommendations warranted by these results. Ms. Roepke will receive a summary of her genetic counseling visit and a copy of her results once available. This information will also be available in Epic.   Ms. Anton questions were answered to her satisfaction today. Our contact information was provided should additional questions or concerns arise. Thank you for the referral and allowing [place] to share in the care of your patient.   Clint Guy, [place], [facility] Licensed, Certified Dispensing [email or web address] Phone:  [phone]  The patient was seen for a total of 35 minutes in face-to-face genetic counseling.  This patient was discussed with Drs. Magrinat, Lindi Adie and/or Burr Medico who agrees with the above.    _______________________________________________________________________ For Office Staff:  Number of people involved in session: 2 Was an Intern/ student involved with case: yes - student observation only.

## 2020-07-17 NOTE — Progress Notes (Signed)
Location of Breast Cancer: Ductal carcinoma in situ (DCIS) of RIGHT breast  Histology per Pathology Report:  (definitive pathology pending upcoming surgery) 06/24/2020 Diagnosis Breast, right, needle core biopsy, upper posterior depth near 12 o'clock - DUCTAL CARCINOMA IN SITU WITH NECROSIS AND CALCIFICATIONS. - SEE MICROSCOPIC DESCRIPTION. Microscopic Comment The DCIS is intermediate to high grade.  Receptor Status: ER(90%), PR (2%)  Did patient present with symptoms (if so, please note symptoms) or was this found on screening mammography?:  Patient had an abnormal mammogram that showed a cluster of right breast microcalcifications around 12:00  Past/Anticipated interventions by surgeon, if any: Scheduled for: RIGHT BREAST LUMPECTOMY WITH RADIOACTIVE SEED LOCALIZATION by Dr. Erroll Luna on 08/04/2020  Past/Anticipated interventions by medical oncology, if any: Scheduled for consult with Dr. Nicholas Lose on 07/21/2020  Lymphedema issues, if any:  Patient denies     Pain issues, if any:  Occasional ache/soreness to biopsy site  SAFETY ISSUES:  Prior radiation? No  Pacemaker/ICD? No  Possible current pregnancy? No  Is the patient on methotrexate? No  Current Complaints / other details:   Patient has received J&J COVID vaccine, as well as Pfizer booster and her annual flu shot

## 2020-07-20 ENCOUNTER — Encounter: Payer: Self-pay | Admitting: Radiation Oncology

## 2020-07-20 ENCOUNTER — Ambulatory Visit
Admission: RE | Admit: 2020-07-20 | Discharge: 2020-07-20 | Disposition: A | Discharge status: Home / Self Care | Payer: BC Managed Care – PPO | Source: Ambulatory Visit | Attending: Radiation Oncology | Admitting: Radiation Oncology

## 2020-07-20 DIAGNOSIS — D0511 Intraductal carcinoma in situ of right breast: Secondary | ICD-10-CM | POA: Diagnosis not present

## 2020-07-20 DIAGNOSIS — Z170 Estrogen receptor positive status [ER+]: Secondary | ICD-10-CM | POA: Diagnosis not present

## 2020-07-20 NOTE — Progress Notes (Signed)
Radiation Oncology         [phone] ________________________________  Initial outpatient Consultation - The patient opted for telemedicine to maximize safety during the pandemic.  MyChart video was used  Name: Laura Weiss MRN: [id number]  Date: 07/20/2020  DOB: 09-24-1970  TF:[id number], Hinton Dyer, DO  Erroll Luna, MD   REFERRING PHYSICIAN: Erroll Luna, MD  DIAGNOSIS:    ICD-10-CM   1. 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 female who had an abnormal mammogram that showed a cluster of right breast microcalcifications around 12:00 position.  Biopsy on 06/24/2020 showed DCIS with necrosis and calcifications.  This was intermediate to high-grade.  ER 90% positive and weak to moderate staining.  PR 2% positive moderate staining.  She underwent genetic testing due to a significant family history in her mother and results are pending.  Tentatively scheduled for: RIGHT BREAST LUMPECTOMY WITH RADIOACTIVE SEED LOCALIZATION by Dr. Erroll Luna on 08/04/2020  Past/Anticipated interventions by medical oncology, if any: Scheduled for consult with Dr. Nicholas Lose on 07/21/2020  Lymphedema issues, if any:  Patient denies     Pain issues, if any:  Occasional ache/soreness to biopsy site  SAFETY ISSUES:  Prior radiation? No  Pacemaker/ICD? No  Possible current pregnancy?  Denies  Is the patient on methotrexate? No  Current Complaints / other details:   Patient has received J&J COVID vaccine, as well as Pfizer booster and her annual flu shot    Currently she works from home.  PREVIOUS RADIATION THERAPY: No  PAST MEDICAL HISTORY:  has a past medical history of Family history of bladder cancer, Family history of breast cancer, Family history of melanoma, Family history of pancreatic cancer, and Family history of rectal  cancer.    PAST SURGICAL HISTORY: Past Surgical History:  Procedure Laterality Date  . BREAST EXCISIONAL BIOPSY Right 2003   atypical hyperplasia  . elective ab      FAMILY HISTORY: family history includes Bladder Cancer (age of onset: 33) in her mother; Breast cancer in her paternal grandmother; Breast cancer (age of onset: 25) in her mother; Melanoma in her maternal grandfather; Pancreatic cancer (age of onset: 25) in her mother; Rectal cancer in an other family member.  SOCIAL HISTORY:  reports that she has never smoked. She has never used smokeless tobacco. She reports current alcohol use. She reports that she does not use drugs.  ALLERGIES: Patient has no known allergies.  MEDICATIONS:  Current Outpatient Medications  Medication Sig Dispense Refill  . dicyclomine (BENTYL) 20 MG tablet Take 20 mg by mouth 4 (four) times daily as needed.     No current facility-administered medications for this encounter.    REVIEW OF SYSTEMS: As above   PHYSICAL EXAM:  vitals were not taken for this visit.   General: Alert and oriented, in no acute distress   LABORATORY DATA:  Lab Results  Component Value Date   WBC 7.2 10/24/2015   HGB 12.5 10/24/2015   HCT 36.1 10/24/2015   MCV 89.6 10/24/2015   PLT 271 10/24/2015   CMP  No results found for: NA, K, CL, CO2, GLUCOSE, BUN, CREATININE, CALCIUM, PROT, ALBUMIN, AST, ALT, ALKPHOS, BILITOT, GFRNONAA, GFRAA       RADIOGRAPHY: MM CLIP PLACEMENT RIGHT  Result Date: 06/24/2020 CLINICAL DATA:  Confirmation of clip placement  after stereotactic tomosynthesis core needle biopsy of calcifications involving the UPPER RIGHT breast at POSTERIOR depth. EXAM: 2D AND TOMOSYNTHESIS DIAGNOSTIC RIGHT MAMMOGRAM POST STEREOTACTIC BIOPSY COMPARISON:  Previous exam(s). FINDINGS: Tomosynthesis and synthesized full field CC and mediolateral images were obtained following stereotactic tomosynthesis guided biopsy of calcifications involving the UPPER RIGHT  breast. The coil shaped tissue marking clip is appropriately positioned at the site of the biopsied calcifications in the UPPER breast, slight UPPER OUTER QUADRANT. There are residual calcifications approximately 7 mm posterosuperior to the clip. Expected post biopsy changes are present without evidence of hematoma. IMPRESSION: Appropriate positioning of the coil shaped tissue marking clip at the site of the biopsied calcifications in the UPPER RIGHT breast at POSTERIOR depth, slight UPPER OUTER QUADRANT. There are residual calcifications approximately 7 mm posterosuperior to the clip. Final Assessment: Post Procedure Mammograms for Marker Placement Electronically Signed   By: Evangeline Dakin M.D.   On: 06/24/2020 10:31   MM RT BREAST BX W LOC DEV 1ST LESION IMAGE BX SPEC STEREO GUIDE  Addendum Date: 06/26/2020   ADDENDUM REPORT: 06/26/2020 11:45 ADDENDUM: Surgical consultation has been rescheduled with Dr. Erroll Luna at [facility] on July 06, 2020. Stacie Acres RN on 06/26/2020 Electronically Signed   By: Evangeline Dakin M.D.   On: 06/26/2020 11:45   Addendum Date: 06/25/2020   ADDENDUM REPORT: 06/25/2020 15:02 ADDENDUM: Pathology revealed INTERMEDIATE TO HIGH GRADE DUCTAL CARCINOMA IN SITU WITH NECROSIS AND CALCIFICATIONS of the upper RIGHT breast, posterior depth near 12 o'clock. This was found to be concordant by Dr. Peggye Fothergill. Pathology results were discussed with the patient by telephone. The patient reported doing well after the biopsy with tenderness at the site. Post biopsy instructions and care were reviewed and questions were answered. The patient was encouraged to call [facility] for any additional concerns. Surgical consultation has been arranged with Dr. Erroll Luna at [facility] on July 13, 2020. Pathology results reported by Stacie Acres RN on 06/25/2020. Electronically Signed   By: Evangeline Dakin M.D.   On:  06/25/2020 15:02   Result Date: 06/26/2020 CLINICAL DATA:  50 year old with a screening detected indeterminate 5 mm group calcifications involving the UPPER RIGHT breast at POSTERIOR depth, near 12 o'clock location. Personal history of excisional biopsy of the RIGHT breast in 2003 for atypical ductal hyperplasia. Family history of breast cancer in her mother at age 5 and in her paternal grandmother. EXAM: RIGHT BREAST STEREOTACTIC CORE NEEDLE BIOPSY COMPARISON:  Previous exams. FINDINGS: The patient and I discussed the procedure of stereotactic-guided biopsy including benefits and alternatives. We discussed the high likelihood of a successful procedure. We discussed the risks of the procedure including infection, bleeding, tissue injury, clip migration, and inadequate sampling. Informed written consent was given. The usual time out protocol was performed immediately prior to the procedure. Lesion quadrant: UPPER breast, near 12 o'clock location, slight UPPER OUTER QUADRANT. Using sterile technique with chlorhexidine as skin antisepsis, 1% lidocaine and 1% lidocaine with epinephrine as local anesthetic, under stereotactic guidance, a 9 gauge Brevera vacuum assisted device was used to perform core needle biopsy of calcifications involving the UPPER RIGHT breast at POSTERIOR depth using a superior approach. Specimen radiograph was performed showing calcifications in all 3 of the core samples. Specimens with calcifications are identified for pathology. At the conclusion of the procedure, a coil shaped tissue marker clip was deployed into the biopsy cavity. Follow-up 2-view mammogram was performed and dictated separately. IMPRESSION: Stereotactic-guided  biopsy of calcifications involving the UPPER RIGHT breast at POSTERIOR depth, slight UPPER OUTER QUADRANT. No apparent complications. Electronically Signed: By: Evangeline Dakin M.D. On: 06/24/2020 10:32      IMPRESSION/PLAN: This is a delightful 50 year old woman  with a new history of right breast DCIS  Although genetic test results are pending, she tentatively anticipates breast conserving surgery.  It was a pleasure meeting the patient today. We discussed the risks, benefits, and side effects of radiotherapy. I recommend radiotherapy to the right breast over 4 weeks to reduce her risk of locoregional recurrence by at least half; we discussed that I would give the patient a few weeks to heal following surgery before starting treatment planning.  We spoke about acute effects including skin irritation and fatigue as well as much less common late effects including internal organ injury or irritation. We spoke about the latest technology that is used to minimize the risk of late effects for patients undergoing radiotherapy to the breast or chest wall. No guarantees of treatment were given. The patient is enthusiastic about proceeding with treatment. I look forward to participating in the patient's care.  I will await her referral back to me for postoperative follow-up and eventual CT simulation/treatment planning.  This encounter was provided by telemedicine platform; patient desired telemedicine during pandemic precautions.  MyChart video was used. The patient has given verbal consent for this type of encounter and has been advised to only accept a meeting of this type in a secure network environment. On date of service, in total, I spent 40 minutes on this encounter.   The attendants for this meeting include Eppie Gibson  and [name] During the encounter, Eppie Gibson was located at [facility] Radiation Oncology Department.  [name] was located at home.   __________________________________________   Eppie Gibson, MD

## 2020-07-20 NOTE — Progress Notes (Signed)
[name] NOTE  Patient Care Team: [name], DO as PCP - General (Family Medicine) Mauro Kaufmann, RN as Oncology Nurse Navigator Rockwell Germany, RN as Oncology Nurse Navigator  CHIEF COMPLAINTS/PURPOSE OF CONSULTATION:  Newly diagnosed right breast DCIS  HISTORY OF PRESENTING ILLNESS:  Laura Weiss 50 y.o. female is here because of recent diagnosis of right breast DCIS. Screening mammogram showed upper right breast calcifications. Diagnostic mammogram on 06/12/20 showed a 0.5cm indeterminate group of calcifications in the upper right breast. Biopsy on 06/24/20 showed high grade ductal carcinoma in situ, ER 90% weak to moderate staining, PR+ 2%. She has a history of a right breast excisional biopsy on 2003 showing atypical ductal hyperplasia. She has a family history of breast cancer in her mother at 45yo. She presents to the clinic today for initial evaluation and discussion of treatment options.   I reviewed her records extensively and collaborated the history with the patient.  SUMMARY OF ONCOLOGIC HISTORY: Oncology History  Ductal carcinoma in situ of right breast  07/14/2020 Initial Diagnosis   Screening mammogram showed upper right breast calcifications, 0.5cm. Biopsy showed high grade DCIS, ER 90% weak to moderate staining, PR+ 2%.      MEDICAL HISTORY:  Past Medical History:  Diagnosis Date  . Family history of bladder cancer   . Family history of breast cancer   . Family history of melanoma   . Family history of pancreatic cancer   . Family history of rectal cancer     SURGICAL HISTORY: Past Surgical History:  Procedure Laterality Date  . BREAST EXCISIONAL BIOPSY Right 2003   atypical hyperplasia  . elective ab      SOCIAL HISTORY: Social History   Socioeconomic History  . Marital status: Married    Spouse name: Not on file  . Number of children: Not on file  . Years of education: Not on file  . Highest education level: Not  on file  Occupational History  . Not on file  Tobacco Use  . Smoking status: Never Smoker  . Smokeless tobacco: Never Used  Vaping Use  . Vaping Use: Never used  Substance and Sexual Activity  . Alcohol use: Yes  . Drug use: Never  . Sexual activity: Yes  Other Topics Concern  . Not on file  Social History Narrative  . Not on file   Social Determinants of Health   Financial Resource Strain: Not on file  Food Insecurity: Not on file  Transportation Needs: Not on file  Physical Activity: Not on file  Stress: Not on file  Social Connections: Not on file  Intimate Partner Violence: Not on file    FAMILY HISTORY: Family History  Problem Relation Age of Onset  . Breast cancer Mother 27  . Bladder Cancer Mother 54  . Pancreatic cancer Mother 88  . Breast cancer Paternal Grandmother        dx 25s/80s  . Melanoma Maternal Grandfather        multiple dx 50s, sun exposure  . Rectal cancer Other        dx late 47s, father's cousin    ALLERGIES:  has No Known Allergies.  MEDICATIONS:  Current Outpatient Medications  Medication Sig Dispense Refill  . dicyclomine (BENTYL) 20 MG tablet Take 20 mg by mouth 4 (four) times daily as needed.     No current facility-administered medications for this visit.    REVIEW OF SYSTEMS:   Constitutional: Denies fevers, chills  or abnormal night sweats Eyes: Denies blurriness of vision, double vision or watery eyes Ears, nose, mouth, throat, and face: Denies mucositis or sore throat Respiratory: Denies cough, dyspnea or wheezes Cardiovascular: Denies palpitation, chest discomfort or lower extremity swelling Gastrointestinal:  Denies nausea, heartburn or change in bowel habits Skin: Denies abnormal skin rashes Lymphatics: Denies new lymphadenopathy or easy bruising Neurological:Denies numbness, tingling or new weaknesses Behavioral/Psych: Mood is stable, no new changes  Breast: Denies any palpable lumps or discharge All other systems  were reviewed with the patient and are negative.  PHYSICAL EXAMINATION: ECOG PERFORMANCE STATUS: 1 - Symptomatic but completely ambulatory  Vitals:   07/21/20 1337  BP: 123/78  Pulse: 77  Resp: 18  Temp: 97.9 F (36.6 C)  SpO2: 100%   Filed Weights   07/21/20 1337  Weight: 211 lb (95.7 kg)     LABORATORY DATA:  I have reviewed the data as listed Lab Results  Component Value Date   WBC 7.2 10/24/2015   HGB 12.5 10/24/2015   HCT 36.1 10/24/2015   MCV 89.6 10/24/2015   PLT 271 10/24/2015   No results found for: NA, K, CL, CO2  RADIOGRAPHIC STUDIES: I have personally reviewed the radiological reports and agreed with the findings in the report.  ASSESSMENT AND PLAN:  Ductal carcinoma in situ of right breast 07/14/2020:Screening mammogram showed upper right breast calcifications, 0.5cm. Biopsy showed high grade DCIS, ER 90% weak to moderate staining, PR+ 2%. (Atypical ductal hyperplasia diagnosed 2006 status post lumpectomy), mother breast cancer age 106  Pathology review: I discussed with the patient the difference between DCIS and invasive breast cancer. It is considered a precancerous lesion. DCIS is classified as a 0. It is generally detected through mammograms as calcifications. We discussed the significance of grades and its impact on prognosis. We also discussed the importance of ER and PR receptors and their implications to adjuvant treatment options. Prognosis of DCIS dependence on grade, comedo necrosis. It is anticipated that if not treated, 20-30% of DCIS can develop into invasive breast cancer.  Recommendation: 1. Breast conserving surgery 2. Followed by adjuvant radiation therapy 3. Followed by antiestrogen therapy with tamoxifen 5 years 4. genetic testing: Labs have been drawn and she is waiting for the results  Tamoxifen counseling: We discussed the risks and benefits of tamoxifen. These include but not limited to insomnia, hot flashes, mood changes, vaginal  dryness, and weight gain. Although rare, serious side effects including endometrial cancer, risk of blood clots were also discussed. We strongly believe that the benefits far outweigh the risks. Patient understands these risks and consented to starting treatment. Planned treatment duration is 5 years.  She has a 19 year old daughter and her 64 year old son.  She works for [employer].  Return to clinic after surgery to discuss the final pathology report and come up with an adjuvant treatment plan.  All questions were answered. The patient knows to call the clinic with any problems, questions or concerns.  Rulon Eisenmenger, MD, MPH 07/21/2020    I, Molly Dorshimer, am acting as scribe for Nicholas Lose, MD.  I have reviewed the above documentation for accuracy and completeness, and I agree with the above.

## 2020-07-21 ENCOUNTER — Other Ambulatory Visit: Payer: Self-pay

## 2020-07-21 ENCOUNTER — Encounter: Payer: Self-pay | Admitting: *Deleted

## 2020-07-21 ENCOUNTER — Inpatient Hospital Stay (HOSPITAL_BASED_OUTPATIENT_CLINIC_OR_DEPARTMENT_OTHER): Payer: BC Managed Care – PPO | Admitting: Hematology and Oncology

## 2020-07-21 DIAGNOSIS — Z803 Family history of malignant neoplasm of breast: Secondary | ICD-10-CM

## 2020-07-21 DIAGNOSIS — Z808 Family history of malignant neoplasm of other organs or systems: Secondary | ICD-10-CM

## 2020-07-21 DIAGNOSIS — Z800 Family history of malignant neoplasm of digestive organs: Secondary | ICD-10-CM

## 2020-07-21 DIAGNOSIS — D0511 Intraductal carcinoma in situ of right breast: Secondary | ICD-10-CM

## 2020-07-21 DIAGNOSIS — Z8052 Family history of malignant neoplasm of bladder: Secondary | ICD-10-CM | POA: Diagnosis not present

## 2020-07-21 DIAGNOSIS — Z79899 Other long term (current) drug therapy: Secondary | ICD-10-CM | POA: Diagnosis not present

## 2020-07-21 NOTE — Assessment & Plan Note (Signed)
07/14/2020:Screening mammogram showed upper right breast calcifications, 0.5cm. Biopsy showed high grade DCIS, ER 90% weak to moderate staining, PR+ 2%.  Pathology review: I discussed with the patient the difference between DCIS and invasive breast cancer. It is considered a precancerous lesion. DCIS is classified as a 0. It is generally detected through mammograms as calcifications. We discussed the significance of grades and its impact on prognosis. We also discussed the importance of ER and PR receptors and their implications to adjuvant treatment options. Prognosis of DCIS dependence on grade, comedo necrosis. It is anticipated that if not treated, 20-30% of DCIS can develop into invasive breast cancer.  Recommendation: 1. Breast conserving surgery 2. Followed by adjuvant radiation therapy 3. Followed by antiestrogen therapy with tamoxifen 5 years  Tamoxifen counseling: We discussed the risks and benefits of tamoxifen. These include but not limited to insomnia, hot flashes, mood changes, vaginal dryness, and weight gain. Although rare, serious side effects including endometrial cancer, risk of blood clots were also discussed. We strongly believe that the benefits far outweigh the risks. Patient understands these risks and consented to starting treatment. Planned treatment duration is 5 years.  Return to clinic after surgery to discuss the final pathology report and come up with an adjuvant treatment plan.

## 2020-07-23 ENCOUNTER — Encounter: Payer: Self-pay | Admitting: Licensed Clinical Social Worker

## 2020-07-23 NOTE — Progress Notes (Signed)
[place] Social Work   Holiday representative contacted patient by phone  to offer support and assess for needs in pt with new diagnosis breast cancer. Patient is doing well overall. Viewing it is more of an inconvenience and really trying to prepare as much as possible to make schedule and chores easier. No financial or resource needs at this time. Patient is working and has two children at home as well as her husband.  CSW informed patient of the support team and support services at [facility].  CSW provided contact information and encouraged patient to call with any questions or concerns.       [place], [place] Worker [employer]

## 2020-07-24 ENCOUNTER — Telehealth: Payer: Self-pay | Admitting: Genetic Counselor

## 2020-07-24 DIAGNOSIS — Z1379 Encounter for other screening for genetic and chromosomal anomalies: Secondary | ICD-10-CM | POA: Insufficient documentation

## 2020-07-24 NOTE — Telephone Encounter (Signed)
Revealed negative genetic testing. Discussed that we do not know why she has breast cancer or why there is cancer in the family. There could be a genetic mutation in the family that Laura Weiss did not inherit. There could also be a mutation in a different gene that we are not testing, or our current technology may not be able detect certain mutations. It will therefore be important for her to stay in contact with genetics to keep up with whether additional testing may be appropriate in the future.   Additional genetic testing through the Common Hereditary Cancers panel is currently pending. We will reach out again once we have those results.

## 2020-07-27 ENCOUNTER — Ambulatory Visit: Payer: Self-pay | Admitting: Genetic Counselor

## 2020-07-27 ENCOUNTER — Encounter: Payer: Self-pay | Admitting: Genetic Counselor

## 2020-07-27 DIAGNOSIS — Z1379 Encounter for other screening for genetic and chromosomal anomalies: Secondary | ICD-10-CM

## 2020-07-27 NOTE — Telephone Encounter (Signed)
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 Her result is still considered normal at this time and should not impact her medical management.

## 2020-07-27 NOTE — Progress Notes (Addendum)
HPI:  Ms. Felling was previously seen in the [facility] due to a personal and family history of cancer and concerns regarding a hereditary predisposition to cancer. Please refer to our prior cancer genetics clinic note for more information regarding our discussion, assessment and recommendations, at the time. Ms. Santy recent genetic test results were disclosed to her, as were recommendations warranted by these results. These results and recommendations are discussed in more detail below.  CANCER HISTORY:  Oncology History  Ductal carcinoma in situ of right breast  07/14/2020 Initial Diagnosis   Screening mammogram showed upper right breast calcifications, 0.5cm. Biopsy showed high grade DCIS, ER 90% weak to moderate staining, PR+ 2%.   07/24/2020 Genetic Testing   Negative genetic testing:  No pathogenic variants detected on the [facility] Breast Cancer STAT panel or Common Hereditary Cancers panel. A variant of uncertain significance (VUS) was detected in the POLD1 gene called c.202+3A>G (Intronic). The report date is 07/24/2020.  The Breast Cancer STAT Panel offered by [facility] includes sequencing and deletion/duplication analysis for the following 9 genes:  ATM, BRCA1, BRCA2, CDH1, CHEK2, PALB2, PTEN, STK11 and TP53. The Common Hereditary Cancers Panel offered by [facility] includes sequencing and/or deletion duplication testing of the following 48 genes: APC, ATM, AXIN2, BARD1, BMPR1A, BRCA1, BRCA2, BRIP1, CDH1, CDK4, CDKN2A (p14ARF), CDKN2A (p16INK4a), CHEK2, CTNNA1, DICER1, EPCAM (Deletion/duplication testing only), GREM1 (promoter region deletion/duplication testing only), KIT, MEN1, MLH1, MSH2, MSH3, MSH6, MUTYH, NBN, NF1, NTHL1, PALB2, PDGFRA, PMS2, POLD1, POLE, PTEN, RAD50, RAD51C, RAD51D, RNF43, SDHB, SDHC, SDHD, SMAD4, SMARCA4. STK11, TP53, TSC1, TSC2, and VHL.  The following genes were evaluated for sequence changes only: SDHA and HOXB13 c.251G>A variant only.     FAMILY  HISTORY:  We obtained a detailed, 4-generation family history.  Significant diagnoses are listed below: Family History  Problem Relation Age of Onset  . Breast cancer Mother 63  . Bladder Cancer Mother 43  . Pancreatic cancer Mother 37  . Breast cancer Paternal Grandmother        dx 80s/80s  . Melanoma Maternal Grandfather        multiple dx 83s, sun exposure  . Rectal cancer Other        dx late 4s, father's cousin   Ms. Rew has one son (age 45) and one daughter (age 51). She has one sister (age 68) who has one daughter. None of these family members have had cancer.  Ms. Genson mother died at age 38 and had a history of breast cancer (diagnosed age 6), bladder cancer (diagnosed age 75), and pancreatic cancer (diagnosed age 22). There are no maternal aunts or uncles. Her maternal grandmother died at age 37. Her maternal grandfather died at age 71 and had a history of multiple melanomas beginning in his 60s. Notably he did have a lot of sun exposure throughout his life.  Ms. Slaydon father is 42 and has not had cancer. There are no paternal aunts or uncles. Her paternal grandmother died at age 34 and had breast cancer diagnosed in her 83s or 66s. Her paternal grandfather died in his 44s of appendicitis. Her father had a maternal cousin who died from rectal cancer in her late 51s.   Patient's maternal ancestors are of [place] descent, and paternal ancestors are of [place] descent. There is no reported Ashkenazi Jewish ancestry. There is no known consanguinity.  GENETIC TEST RESULTS: Genetic testing reported out on 07/24/2020 through the [facility] Breast Cancer STAT + Common Hereditary Cancers panels. No  pathogenic variants were detected.   The Breast Cancer STAT Panel offered by [facility] includes sequencing and deletion/duplication analysis for the following 9 genes:  ATM, BRCA1, BRCA2, CDH1, CHEK2, PALB2, PTEN, STK11 and TP53. The Common Hereditary Cancers Panel offered by [facility]  includes sequencing and/or deletion duplication testing of the following 48 genes: APC, ATM, AXIN2, BARD1, BMPR1A, BRCA1, BRCA2, BRIP1, CDH1, CDK4, CDKN2A (p14ARF), CDKN2A (p16INK4a), CHEK2, CTNNA1, DICER1, EPCAM (Deletion/duplication testing only), GREM1 (promoter region deletion/duplication testing only), KIT, MEN1, MLH1, MSH2, MSH3, MSH6, MUTYH, NBN, NF1, NTHL1, PALB2, PDGFRA, PMS2, POLD1, POLE, PTEN, RAD50, RAD51C, RAD51D, RNF43, SDHB, SDHC, SDHD, SMAD4, SMARCA4. STK11, TP53, TSC1, TSC2, and VHL.  The following genes were evaluated for sequence changes only: SDHA and HOXB13 c.251G>A variant only. The test report will be scanned into EPIC and located under the Molecular Pathology section of the Results Review tab.  A portion of the result report is included below for reference.     We discussed with Ms. Strohmeier that because current genetic testing is not perfect, it is possible there may be a gene mutation in one of these genes that current testing cannot detect, but that chance is small.  We also discussed that there could be another gene that has not yet been discovered, or that we have not yet tested, that is responsible for the cancer diagnoses in the family. It is also possible there is a hereditary cause for the cancer in the family that Ms. Membreno did not inherit and therefore was not identified in her testing.  Therefore, it is important to remain in touch with cancer genetics in the future so that we can continue to offer Ms. Wagster the most up to date genetic testing.   Genetic testing did identify a variant of uncertain significance (VUS) in the POLD1 gene called c.202+3A>G (Intronic).  At this time, it is unknown if this variant is associated with increased cancer risk or if this is a normal finding, but most variants such as this get reclassified to being inconsequential. It should not be used to make medical management decisions. With time, we suspect the lab will determine the significance  of this variant, if any. If we do learn more about it, we will try to contact Ms. Lyon to discuss it further. However, it is important to stay in touch with [place] periodically and keep the address and phone number up to date.  UPDATE:  The POLD1 c.202+3A>G (Intronic) VUS was reclassified to "Likely Benign" on 09/14/2020. The change in variant classification was made as a result of re-review of the evidence in light of new variant interpretation guidelines and/or new information.  CANCER SCREENING RECOMMENDATIONS: Ms. Boardley test result is considered negative (normal).  This means that we have not identified a hereditary cause for her personal and family history of cancer at this time. While reassuring, this does not definitively rule out a hereditary predisposition to cancer. It is still possible that there could be genetic mutations that are undetectable by current technology. There could be genetic mutations in genes that have not been tested or identified to increase cancer risk.  Therefore, it is recommended she continue to follow the cancer management and screening guidelines provided by her oncology and primary healthcare providers.   An individual's cancer risk and medical management are not determined by genetic test results alone. Overall cancer risk assessment incorporates additional factors, including personal medical history, family history, and any available genetic information that may result in a personalized plan for  cancer prevention and surveillance.  RECOMMENDATIONS FOR FAMILY MEMBERS:  Individuals in this family might be at some increased risk of developing cancer, over the general population risk, simply due to the family history of cancer.  We recommended women in this family have a yearly mammogram beginning at age 53, or 26 years younger than the earliest onset of cancer, an annual clinical breast exam, and perform monthly breast self-exams. Women in this family should also have a  gynecological exam as recommended by their primary provider. All family members should be referred for colonoscopy starting at age 64.  It is also possible there is a hereditary cause for the cancer in Ms. Stlaurent's family that she did not inherit and therefore was not identified in her. Based on Ms. Guedes's family history, we recommended her sister have genetic counseling and testing. Ms. Reitano will let us know if we can be of any assistance in coordinating genetic counseling and/or testing for this family member.   FOLLOW-UP: Lastly, we discussed with Ms. Lober that cancer genetics is a rapidly advancing field and it is possible that new genetic tests will be appropriate for her and/or her family members in the future. We encouraged her to remain in contact with cancer genetics on an annual basis so we can update her personal and family histories and let her know of advances in cancer genetics that may benefit this family.   Our contact number was provided. Ms. Loh questions were answered to her satisfaction, and she knows she is welcome to call us at anytime with additional questions or concerns.   Clint Guy, MS, [facility] Genetic Counselor [email or web address] Phone: [phone]

## 2020-07-28 NOTE — Progress Notes (Addendum)
Your procedure is scheduled on Tuesday, January 25th.  Report to [facility] Main Entrance "A" at 9:00 A.M., and check in at the Admitting office.  Call this number if you have problems the morning of surgery:  [phone]  Call [phone] if you have any questions prior to your surgery date Monday-Friday 8am-4pm   Remember:  Do not eat after midnight the night before your surgery  You may drink clear liquids until 8:00 A.M. the morning of your surgery.   Clear liquids allowed are: Water, Non-Citrus Juices (without pulp), Carbonated Beverages, Clear Tea, Black Coffee Only, and Gatorade    If needed, with a SIP OF WATER the morning of surgery, take: dicyclomine (BENTYL)  Eye drops  As of today, STOP taking any Aspirin (unless otherwise instructed by your surgeon) Aleve, Naproxen, Ibuprofen, Motrin, Advil, Goody's, BC's, all herbal medications, fish oil, and all vitamins.             Do not wear jewelry, make up, or nail polish            Do not wear lotions, powders, perfumes, or deodorant.            Do not shave 48 hours prior to surgery.              Do not bring valuables to the hospital.            [facility] is not responsible for any belongings or valuables.  Do NOT Smoke (Tobacco/Vaping) or drink Alcohol 24 hours prior to your procedure If you use a CPAP at night, you may bring all equipment for your overnight stay.   Contacts, glasses, dentures or bridgework may not be worn into surgery.      For patients admitted to the hospital, discharge time will be determined by your treatment team.   Patients discharged the day of surgery will not be allowed to drive home, and someone needs to stay with them for 24 hours.  Special instructions:   [place] [facility]- Preparing For Surgery  Before surgery, you can play an important role. Because skin is not sterile, your skin needs to be as free of germs as possible. You can reduce the number of germs on your skin by washing with CHG  (chlorahexidine gluconate) Soap before surgery.  CHG is an antiseptic cleaner which kills germs and bonds with the skin to continue killing germs even after washing.    Oral Hygiene is also important to reduce your risk of infection.  Remember - BRUSH YOUR TEETH THE MORNING OF SURGERY WITH YOUR REGULAR TOOTHPASTE  Please do not use if you have an allergy to CHG or antibacterial soaps. If your skin becomes reddened/irritated stop using the CHG.  Do not shave (including legs and underarms) for at least 48 hours prior to first CHG shower. It is OK to shave your face.  Please follow these instructions carefully.   1. Shower the NIGHT BEFORE SURGERY and the MORNING OF SURGERY with CHG Soap.   2. If you chose to wash your hair, wash your hair first as usual with your normal shampoo.  3. After you shampoo, rinse your hair and body thoroughly to remove the shampoo.  4. Use CHG as you would any other liquid soap. You can apply CHG directly to the skin and wash gently with a scrungie or a clean washcloth.   5. Apply the CHG Soap to your body ONLY FROM THE NECK DOWN.  Do not use on open  wounds or open sores. Avoid contact with your eyes, ears, mouth and genitals (private parts). Wash Face and genitals (private parts)  with your normal soap.   6. Wash thoroughly, paying special attention to the area where your surgery will be performed.  7. Thoroughly rinse your body with warm water from the neck down.  8. DO NOT shower/wash with your normal soap after using and rinsing off the CHG Soap.  9. Pat yourself dry with a CLEAN TOWEL.  10. Wear CLEAN PAJAMAS to bed the night before surgery  11. Place CLEAN SHEETS on your bed the night of your first shower and DO NOT SLEEP WITH PETS.  Day of Surgery: Wear Clean/Comfortable clothing the morning of surgery Do not apply any deodorants/lotions.   Remember to brush your teeth WITH YOUR REGULAR TOOTHPASTE.   Please read over the following fact sheets that  you were given.

## 2020-07-29 ENCOUNTER — Encounter (HOSPITAL_COMMUNITY)
Admission: RE | Admit: 2020-07-29 | Discharge: 2020-07-29 | Disposition: A | Discharge status: Home / Self Care | Payer: BC Managed Care – PPO | Source: Ambulatory Visit | Attending: Surgery | Admitting: Surgery

## 2020-07-29 ENCOUNTER — Encounter (HOSPITAL_COMMUNITY): Payer: Self-pay

## 2020-07-29 ENCOUNTER — Other Ambulatory Visit: Payer: Self-pay

## 2020-07-29 DIAGNOSIS — Z01812 Encounter for preprocedural laboratory examination: Secondary | ICD-10-CM | POA: Diagnosis not present

## 2020-07-29 DIAGNOSIS — D0511 Intraductal carcinoma in situ of right breast: Secondary | ICD-10-CM

## 2020-07-29 LAB — CBC
HCT: 37.5 % (ref 36.0–46.0)
Hemoglobin: 13.1 g/dL (ref 12.0–15.0)
MCH: 32.8 pg (ref 26.0–34.0)
MCHC: 34.9 g/dL (ref 30.0–36.0)
MCV: 93.8 fL (ref 80.0–100.0)
Platelets: 263 10*3/uL (ref 150–400)
RBC: 4 MIL/uL (ref 3.87–5.11)
RDW: 13 % (ref 11.5–15.5)
WBC: 7.2 10*3/uL (ref 4.0–10.5)
nRBC: 0 % (ref 0.0–0.2)

## 2020-07-29 LAB — BASIC METABOLIC PANEL
Anion gap: 8 (ref 5–15)
BUN: 10 mg/dL (ref 6–20)
CO2: 25 mmol/L (ref 22–32)
Calcium: 9.7 mg/dL (ref 8.9–10.3)
Chloride: 105 mmol/L (ref 98–111)
Creatinine, Ser: 0.86 mg/dL (ref 0.44–1.00)
GFR, Estimated: >60 mL/min (ref >60)
Glucose, Bld: 106 mg/dL — ABNORMAL HIGH (ref 70–99)
Potassium: 4.5 mmol/L (ref 3.5–5.1)
Sodium: 138 mmol/L (ref 135–145)

## 2020-07-29 MED ORDER — CHLORHEXIDINE GLUCONATE CLOTH 2 % EX PADS: 6.0000 | MEDICATED_PAD | Freq: Once | CUTANEOUS | Status: DC

## 2020-07-29 NOTE — Progress Notes (Signed)
PCP - D.COLLINS Cardiologist - NA  -   -  Chest x-ray - NA EKG - NA    Aspirin Instructions:TOP  ERAS Protcol -INSTRUCTIONS GIVEN   COVID TEST- FOR 07/31/20   Anesthesia review:  NA Patient denies shortness of breath, fever, cough and chest pain at PAT appointment   All instructions explained to the patient, with a verbal understanding of the material. Patient agrees to go over the instructions while at home for a better understanding. Patient also instructed to self quarantine after being tested for COVID-19. The opportunity to ask questions was provided.

## 2020-07-31 ENCOUNTER — Other Ambulatory Visit (HOSPITAL_COMMUNITY)
Admission: RE | Admit: 2020-07-31 | Discharge: 2020-07-31 | Disposition: A | Discharge status: Home / Self Care | Payer: BC Managed Care – PPO | Source: Ambulatory Visit | Attending: Surgery | Admitting: Surgery

## 2020-07-31 DIAGNOSIS — Z20822 Contact with and (suspected) exposure to covid-19: Secondary | ICD-10-CM | POA: Diagnosis not present

## 2020-07-31 DIAGNOSIS — Z01812 Encounter for preprocedural laboratory examination: Secondary | ICD-10-CM | POA: Insufficient documentation

## 2020-07-31 LAB — SARS CORONAVIRUS 2 (TAT 6-24 HRS): SARS Coronavirus 2: NEGATIVE

## 2020-08-03 ENCOUNTER — Other Ambulatory Visit: Payer: Self-pay

## 2020-08-03 ENCOUNTER — Ambulatory Visit
Admission: RE | Admit: 2020-08-03 | Discharge: 2020-08-03 | Disposition: A | Discharge status: Home / Self Care | Payer: BC Managed Care – PPO | Source: Ambulatory Visit | Attending: Surgery | Admitting: Surgery

## 2020-08-03 DIAGNOSIS — D0511 Intraductal carcinoma in situ of right breast: Secondary | ICD-10-CM | POA: Diagnosis not present

## 2020-08-04 ENCOUNTER — Ambulatory Visit
Admission: RE | Admit: 2020-08-04 | Discharge: 2020-08-04 | Disposition: A | Discharge status: Home / Self Care | Payer: BC Managed Care – PPO | Source: Ambulatory Visit | Attending: Surgery | Admitting: Surgery

## 2020-08-04 ENCOUNTER — Encounter (HOSPITAL_COMMUNITY): Payer: Self-pay | Admitting: Surgery

## 2020-08-04 ENCOUNTER — Ambulatory Visit (HOSPITAL_COMMUNITY)
Admission: RE | Admit: 2020-08-04 | Discharge: 2020-08-04 | Disposition: A | Discharge status: Home / Self Care | Payer: BC Managed Care – PPO | Attending: Surgery | Admitting: Surgery

## 2020-08-04 ENCOUNTER — Ambulatory Visit (HOSPITAL_COMMUNITY): Payer: BC Managed Care – PPO | Admitting: Certified Registered Nurse Anesthetist

## 2020-08-04 ENCOUNTER — Ambulatory Visit (HOSPITAL_COMMUNITY): Payer: BC Managed Care – PPO | Admitting: Physician Assistant

## 2020-08-04 ENCOUNTER — Other Ambulatory Visit: Payer: Self-pay

## 2020-08-04 ENCOUNTER — Encounter (HOSPITAL_COMMUNITY)
Admission: RE | Disposition: A | Discharge status: Home / Self Care | Payer: Self-pay | Source: Home / Self Care | Attending: Surgery

## 2020-08-04 DIAGNOSIS — Z170 Estrogen receptor positive status [ER+]: Secondary | ICD-10-CM | POA: Insufficient documentation

## 2020-08-04 DIAGNOSIS — Z803 Family history of malignant neoplasm of breast: Secondary | ICD-10-CM | POA: Insufficient documentation

## 2020-08-04 DIAGNOSIS — Z800 Family history of malignant neoplasm of digestive organs: Secondary | ICD-10-CM | POA: Insufficient documentation

## 2020-08-04 DIAGNOSIS — D0511 Intraductal carcinoma in situ of right breast: Secondary | ICD-10-CM | POA: Diagnosis not present

## 2020-08-04 DIAGNOSIS — D0512 Intraductal carcinoma in situ of left breast: Secondary | ICD-10-CM | POA: Diagnosis not present

## 2020-08-04 DIAGNOSIS — N6489 Other specified disorders of breast: Secondary | ICD-10-CM | POA: Diagnosis not present

## 2020-08-04 HISTORY — PX: BREAST LUMPECTOMY WITH RADIOACTIVE SEED LOCALIZATION: SHX6424

## 2020-08-04 HISTORY — PX: BREAST LUMPECTOMY: SHX2

## 2020-08-04 LAB — POCT PREGNANCY, URINE: Preg Test, Ur: NEGATIVE

## 2020-08-04 SURGERY — BREAST LUMPECTOMY WITH RADIOACTIVE SEED LOCALIZATION
Anesthesia: General | Site: Breast | Laterality: Right

## 2020-08-04 MED ORDER — LIDOCAINE 2% (20 MG/ML) 5 ML SYRINGE
INTRAMUSCULAR | Status: DC | PRN
  Administered 2020-08-04: 60 mg via INTRAVENOUS

## 2020-08-04 MED ORDER — ONDANSETRON HCL 4 MG/2ML IJ SOLN
INTRAMUSCULAR | Status: DC | PRN
  Administered 2020-08-04: 4 mg via INTRAVENOUS

## 2020-08-04 MED ORDER — DEXAMETHASONE SODIUM PHOSPHATE 10 MG/ML IJ SOLN
INTRAMUSCULAR | Status: DC | PRN
  Administered 2020-08-04: 5 mg via INTRAVENOUS

## 2020-08-04 MED ORDER — AMISULPRIDE (ANTIEMETIC) 5 MG/2ML IV SOLN: 10.0000 mg | Freq: Once | INTRAVENOUS | Status: DC | PRN

## 2020-08-04 MED ORDER — MIDAZOLAM HCL 2 MG/2ML IJ SOLN
INTRAMUSCULAR | Status: AC
  Filled 2020-08-04: qty 2

## 2020-08-04 MED ORDER — DEXAMETHASONE SODIUM PHOSPHATE 10 MG/ML IJ SOLN
INTRAMUSCULAR | Status: AC
  Filled 2020-08-04: qty 2

## 2020-08-04 MED ORDER — FENTANYL CITRATE (PF) 250 MCG/5ML IJ SOLN
INTRAMUSCULAR | Status: AC
  Filled 2020-08-04: qty 5

## 2020-08-04 MED ORDER — FENTANYL CITRATE (PF) 100 MCG/2ML IJ SOLN: 25.0000 ug | INTRAMUSCULAR | Status: DC | PRN

## 2020-08-04 MED ORDER — BUPIVACAINE-EPINEPHRINE (PF) 0.25% -1:200000 IJ SOLN
INTRAMUSCULAR | Status: AC
  Filled 2020-08-04: qty 10

## 2020-08-04 MED ORDER — PROPOFOL 10 MG/ML IV BOLUS
INTRAVENOUS | Status: AC
  Filled 2020-08-04: qty 20

## 2020-08-04 MED ORDER — MIDAZOLAM HCL 2 MG/2ML IJ SOLN
INTRAMUSCULAR | Status: DC | PRN
  Administered 2020-08-04: 2 mg via INTRAVENOUS

## 2020-08-04 MED ORDER — CHLORHEXIDINE GLUCONATE 0.12 % MT SOLN
15.0000 mL | Freq: Once | OROMUCOSAL | Status: AC
  Administered 2020-08-04: 15 mL via OROMUCOSAL
  Filled 2020-08-04: qty 15

## 2020-08-04 MED ORDER — CELECOXIB 200 MG PO CAPS
200.0000 mg | ORAL_CAPSULE | Freq: Once | ORAL | Status: AC
  Administered 2020-08-04: 200 mg via ORAL
  Filled 2020-08-04: qty 1

## 2020-08-04 MED ORDER — LACTATED RINGERS IV SOLN: INTRAVENOUS | Status: DC

## 2020-08-04 MED ORDER — EPHEDRINE 5 MG/ML INJ
INTRAVENOUS | Status: AC
  Filled 2020-08-04: qty 10

## 2020-08-04 MED ORDER — ORAL CARE MOUTH RINSE: 15.0000 mL | Freq: Once | OROMUCOSAL | Status: AC

## 2020-08-04 MED ORDER — HYDROCODONE-ACETAMINOPHEN 5-325 MG PO TABS: 1.0000 | ORAL_TABLET | Freq: Four times a day (QID) | ORAL | 0 refills | Status: DC | PRN

## 2020-08-04 MED ORDER — ACETAMINOPHEN 500 MG PO TABS
1000.0000 mg | ORAL_TABLET | Freq: Once | ORAL | Status: AC
  Administered 2020-08-04: 1000 mg via ORAL
  Filled 2020-08-04: qty 2

## 2020-08-04 MED ORDER — CEFAZOLIN SODIUM-DEXTROSE 2-4 GM/100ML-% IV SOLN
2.0000 g | INTRAVENOUS | Status: AC
  Administered 2020-08-04: 2 g via INTRAVENOUS
  Filled 2020-08-04: qty 100

## 2020-08-04 MED ORDER — LIDOCAINE 2% (20 MG/ML) 5 ML SYRINGE
INTRAMUSCULAR | Status: AC
  Filled 2020-08-04: qty 10

## 2020-08-04 MED ORDER — IBUPROFEN 800 MG PO TABS: 800.0000 mg | ORAL_TABLET | Freq: Three times a day (TID) | ORAL | 0 refills | Status: DC | PRN

## 2020-08-04 MED ORDER — ROCURONIUM BROMIDE 10 MG/ML (PF) SYRINGE
PREFILLED_SYRINGE | INTRAVENOUS | Status: AC
  Filled 2020-08-04: qty 10

## 2020-08-04 MED ORDER — DEXMEDETOMIDINE (PRECEDEX) IN NS 20 MCG/5ML (4 MCG/ML) IV SYRINGE
PREFILLED_SYRINGE | INTRAVENOUS | Status: AC
  Filled 2020-08-04: qty 10

## 2020-08-04 MED ORDER — ONDANSETRON HCL 4 MG/2ML IJ SOLN
INTRAMUSCULAR | Status: AC
  Filled 2020-08-04: qty 4

## 2020-08-04 MED ORDER — FENTANYL CITRATE (PF) 250 MCG/5ML IJ SOLN
INTRAMUSCULAR | Status: DC | PRN
  Administered 2020-08-04: 50 ug via INTRAVENOUS
  Administered 2020-08-04 (×2): 25 ug via INTRAVENOUS
  Administered 2020-08-04 (×3): 50 ug via INTRAVENOUS

## 2020-08-04 MED ORDER — BUPIVACAINE-EPINEPHRINE 0.25% -1:200000 IJ SOLN
INTRAMUSCULAR | Status: DC | PRN
  Administered 2020-08-04 (×2): 10 mL

## 2020-08-04 MED ORDER — PROPOFOL 10 MG/ML IV BOLUS
INTRAVENOUS | Status: DC | PRN
  Administered 2020-08-04: 200 mg via INTRAVENOUS

## 2020-08-04 MED ORDER — KETOROLAC TROMETHAMINE 30 MG/ML IJ SOLN
INTRAMUSCULAR | Status: AC
  Filled 2020-08-04: qty 1

## 2020-08-04 SURGICAL SUPPLY — 42 items
ADH SKN CLS APL DERMABOND .7 (GAUZE/BANDAGES/DRESSINGS) ×1
APL PRP STRL LF DISP 70% ISPRP (MISCELLANEOUS) ×1
APPLIER CLIP 9.375 MED OPEN (MISCELLANEOUS) ×2
APR CLP MED 9.3 20 MLT OPN (MISCELLANEOUS) ×1
BINDER BREAST LRG (GAUZE/BANDAGES/DRESSINGS) ×1 IMPLANT
BINDER BREAST XLRG (GAUZE/BANDAGES/DRESSINGS) IMPLANT
CANISTER SUCT 3000ML PPV (MISCELLANEOUS) ×1 IMPLANT
CHLORAPREP W/TINT 26 (MISCELLANEOUS) ×2 IMPLANT
CLIP APPLIE 9.375 MED OPEN (MISCELLANEOUS) IMPLANT
COVER PROBE W GEL 5X96 (DRAPES) ×2 IMPLANT
COVER SURGICAL LIGHT HANDLE (MISCELLANEOUS) ×2 IMPLANT
COVER WAND RF STERILE (DRAPES) ×1 IMPLANT
DERMABOND ADVANCED (GAUZE/BANDAGES/DRESSINGS) ×1
DERMABOND ADVANCED .7 DNX12 (GAUZE/BANDAGES/DRESSINGS) ×1 IMPLANT
DEVICE DUBIN SPECIMEN MAMMOGRA (MISCELLANEOUS) ×2 IMPLANT
DRAPE CHEST BREAST 15X10 FENES (DRAPES) ×2 IMPLANT
ELECT CAUTERY BLADE 6.4 (BLADE) ×2 IMPLANT
ELECT REM PT RETURN 9FT ADLT (ELECTROSURGICAL) ×2
ELECTRODE REM PT RTRN 9FT ADLT (ELECTROSURGICAL) ×1 IMPLANT
GLOVE BIO SURGEON STRL SZ8 (GLOVE) ×2 IMPLANT
GLOVE SRG 8 PF TXTR STRL LF DI (GLOVE) ×1 IMPLANT
GLOVE SURG UNDER POLY LF SZ8 (GLOVE) ×2
GOWN STRL REUS W/ TWL LRG LVL3 (GOWN DISPOSABLE) ×1 IMPLANT
GOWN STRL REUS W/ TWL XL LVL3 (GOWN DISPOSABLE) ×1 IMPLANT
GOWN STRL REUS W/TWL LRG LVL3 (GOWN DISPOSABLE) ×2
GOWN STRL REUS W/TWL XL LVL3 (GOWN DISPOSABLE) ×2
KIT BASIN OR (CUSTOM PROCEDURE TRAY) ×2 IMPLANT
KIT MARKER MARGIN INK (KITS) ×1 IMPLANT
LIGHT WAVEGUIDE WIDE FLAT (MISCELLANEOUS) IMPLANT
NDL HYPO 25GX1X1/2 BEV (NEEDLE) IMPLANT
NEEDLE HYPO 25GX1X1/2 BEV (NEEDLE) ×2 IMPLANT
NS IRRIG 1000ML POUR BTL (IV SOLUTION) ×1 IMPLANT
PACK GENERAL/GYN (CUSTOM PROCEDURE TRAY) ×2 IMPLANT
SUT MNCRL AB 4-0 PS2 18 (SUTURE) ×2 IMPLANT
SUT SILK 2 0 SH (SUTURE) IMPLANT
SUT VIC AB 2-0 SH 27 (SUTURE)
SUT VIC AB 2-0 SH 27XBRD (SUTURE) IMPLANT
SUT VIC AB 3-0 SH 27 (SUTURE)
SUT VIC AB 3-0 SH 27X BRD (SUTURE) IMPLANT
SUT VIC AB 3-0 SH 8-18 (SUTURE) ×2 IMPLANT
SYR CONTROL 10ML LL (SYRINGE) ×1 IMPLANT
TOWEL GREEN STERILE FF (TOWEL DISPOSABLE) IMPLANT

## 2020-08-04 NOTE — Anesthesia Preprocedure Evaluation (Signed)
Anesthesia Evaluation  Patient identified by MRN, date of birth, ID band Patient awake    Reviewed: Allergy & Precautions, NPO status , Patient's Chart, lab work & pertinent test results  Airway Mallampati: II  TM Distance: >3 FB Neck ROM: Full    Dental  (+) Dental Advisory Given   Pulmonary neg pulmonary ROS,    breath sounds clear to auscultation       Cardiovascular negative cardio ROS   Rhythm:Regular Rate:Normal     Neuro/Psych negative neurological ROS     GI/Hepatic negative GI ROS, Neg liver ROS,   Endo/Other  negative endocrine ROS  Renal/GU negative Renal ROS     Musculoskeletal   Abdominal   Peds  Hematology negative hematology ROS (+)   Anesthesia Other Findings   Reproductive/Obstetrics                             Anesthesia Physical Anesthesia Plan  ASA: II  Anesthesia Plan: General   Post-op Pain Management:    Induction: Intravenous  PONV Risk Score and Plan: 3 and Ondansetron, Dexamethasone, Midazolam and Treatment may vary due to age or medical condition  Airway Management Planned: LMA  Additional Equipment:   Intra-op Plan:   Post-operative Plan: Extubation in OR  Informed Consent: I have reviewed the patients History and Physical, chart, labs and discussed the procedure including the risks, benefits and alternatives for the proposed anesthesia with the patient or authorized representative who has indicated his/her understanding and acceptance.     Dental advisory given  Plan Discussed with: CRNA  Anesthesia Plan Comments:         Anesthesia Quick Evaluation

## 2020-08-04 NOTE — Transfer of Care (Signed)
Immediate Anesthesia Transfer of Care Note  Patient: Laura Weiss  Procedure(s) Performed: RIGHT BREAST LUMPECTOMY WITH RADIOACTIVE SEED LOCALIZATION (Right Breast)  Patient Location: PACU  Anesthesia Type:General  Level of Consciousness: awake, alert  and patient cooperative  Airway & Oxygen Therapy: Patient Spontanous Breathing  Post-op Assessment: Report given to RN and Post -op Vital signs reviewed and stable  Post vital signs: Reviewed and stable  Last Vitals:  Vitals Value Taken Time  BP 146/100 08/04/20 1152  Temp    Pulse 75 08/04/20 1153  Resp 16 08/04/20 1153  SpO2 100 % 08/04/20 1153  Vitals shown include unvalidated device data.  Last Pain:  Vitals:   08/04/20 0954  TempSrc:   PainSc: 0-No pain         Complications: No complications documented.

## 2020-08-04 NOTE — Anesthesia Postprocedure Evaluation (Signed)
Anesthesia Post Note  Patient: Laura Weiss  Procedure(s) Performed: RIGHT BREAST LUMPECTOMY WITH RADIOACTIVE SEED LOCALIZATION (Right Breast)     Patient location during evaluation: PACU Anesthesia Type: General Level of consciousness: awake and alert Pain management: pain level controlled Vital Signs Assessment: post-procedure vital signs reviewed and stable Respiratory status: spontaneous breathing, nonlabored ventilation, respiratory function stable and patient connected to nasal cannula oxygen Cardiovascular status: blood pressure returned to baseline and stable Postop Assessment: no apparent nausea or vomiting Anesthetic complications: no   No complications documented.  Last Vitals:  Vitals:   08/04/20 1222 08/04/20 1234  BP: (!) 136/97 (!) 131/96  Pulse: 74 77  Resp: 11 13  Temp:  (!) 36.2 C  SpO2: 99% 100%    Last Pain:  Vitals:   08/04/20 0954  TempSrc:   PainSc: 0-No pain                 Tiajuana Amass

## 2020-08-04 NOTE — Op Note (Signed)
Preoperative diagnosis: Right breast DCIS upper outer quadrant  Postop diagnosis: Same  Procedure right breast seed localized lumpectomy  Surgeon: Erroll Luna, MD  Assistant:Dr Raman MD   Anesthesia: LMA with local  EBL: Minimal  Specimen right breast tissue with seed and clip verified by Faxitron plus additional shave margins  Drains: None  Indications for procedure: The patient is a 50 year old female with right breast DCIS diagnosed by mammogram and core biopsy.  She opted for breast conserving surgery.  She presents for right breast seed localized lumpectomy.The procedure has been discussed with the patient. Alternatives to surgery have been discussed with the patient.  Risks of surgery include bleeding,  Infection,  Seroma formation, death, cosmesis, need for further surgery and treatments and the need for further surgery.   The patient understands and wishes to proceed.  Description of procedure: The patient was met in the holding area and questions were answered.  Neoprobe used to localize seed right breast upper outer quadrant and films available for review.  She was taken to the operating room.  She is placed supine upon the OR table.  After induction general esthesia, right breast was prepped and draped in sterile fashion timeout performed.  Proper patient, site and procedure verified.  Neoprobe used to identify the seed again.  Incision made along the lateral border of the nipple were complex.  Dissection was carried down and all tissue and the seed and clip were excised with a grossly negative margin.  Given the fact she had DCIS, I took additional shave margins of all margins except the posterior margin which corresponded to the pectoralis muscle.  These were sent separately after orientation with ink as was the additional specimen after verification with the Faxitron.  Cavity was irrigated.  Clips placed to mark the cavity.  Cavity closed with 3-0 Vicryl and 4 Monocryl.   Dermabond was applied.  Breast binder placed.  All counts found to be correct.  The patient was awoke extubated taken to recovery in satisfactory condition.

## 2020-08-04 NOTE — Interval H&P Note (Signed)
History and Physical Interval Note:  08/04/2020 10:39 AM  Laura Weiss  has presented today for surgery, with the diagnosis of ductal carcinoma in situ right breast.  The various methods of treatment have been discussed with the patient and family. After consideration of risks, benefits and other options for treatment, the patient has consented to  Procedure(s): RIGHT BREAST LUMPECTOMY WITH RADIOACTIVE SEED LOCALIZATION (Right) as a surgical intervention.  The patient's history has been reviewed, patient examined, no change in status, stable for surgery.  I have reviewed the patient's chart and labs.  Questions were answered to the patient's satisfaction.     [place]

## 2020-08-04 NOTE — Anesthesia Procedure Notes (Signed)
Procedure Name: LMA Insertion Date/Time: 08/04/2020 10:49 AM Performed by: Janace Litten, CRNA Pre-anesthesia Checklist: Patient identified, Emergency Drugs available, Suction available and Patient being monitored Patient Re-evaluated:Patient Re-evaluated prior to induction Oxygen Delivery Method: Circle System Utilized Preoxygenation: Pre-oxygenation with 100% oxygen Induction Type: IV induction Ventilation: Mask ventilation without difficulty LMA: LMA inserted LMA Size: 3.0 Number of attempts: 1 Placement Confirmation: positive ETCO2 Tube secured with: Tape Dental Injury: Teeth and Oropharynx as per pre-operative assessment

## 2020-08-04 NOTE — Discharge Instructions (Signed)
[facility] °Office Phone Number [phone] ° °BREAST BIOPSY/ PARTIAL MASTECTOMY: POST OP INSTRUCTIONS ° °Always review your discharge instruction sheet given to you by the facility where your surgery was performed. ° °IF YOU HAVE DISABILITY OR FAMILY LEAVE FORMS, YOU MUST BRING THEM TO THE OFFICE FOR PROCESSING.  DO NOT GIVE THEM TO YOUR DOCTOR. ° °1. A prescription for pain medication may be given to you upon discharge.  Take your pain medication as prescribed, if needed.  If narcotic pain medicine is not needed, then you may take acetaminophen (Tylenol) or ibuprofen (Advil) as needed. °2. Take your usually prescribed medications unless otherwise directed °3. If you need a refill on your pain medication, please contact your pharmacy.  They will contact our office to request authorization.  Prescriptions will not be filled after 5pm or on week-ends. °4. You should eat very light the first 24 hours after surgery, such as soup, crackers, pudding, etc.  Resume your normal diet the day after surgery. °5. Most patients will experience some swelling and bruising in the breast.  Ice packs and a good support bra will help.  Swelling and bruising can take several days to resolve.  °6. It is common to experience some constipation if taking pain medication after surgery.  Increasing fluid intake and taking a stool softener will usually help or prevent this problem from occurring.  A mild laxative (Milk of Magnesia or Miralax) should be taken according to package directions if there are no bowel movements after 48 hours. °7. Unless discharge instructions indicate otherwise, you may remove your bandages 24-48 hours after surgery, and you may shower at that time.  You may have steri-strips (small skin tapes) in place directly over the incision.  These strips should be left on the skin for 7-10 days.  If your surgeon used skin glue on the incision, you may shower in 24 hours.  The glue will flake off over the  next 2-3 weeks.  Any sutures or staples will be removed at the office during your follow-up visit. °8. ACTIVITIES:  You may resume regular daily activities (gradually increasing) beginning the next day.  Wearing a good support bra or sports bra minimizes pain and swelling.  You may have sexual intercourse when it is comfortable. °a. You may drive when you no longer are taking prescription pain medication, you can comfortably wear a seatbelt, and you can safely maneuver your car and apply brakes. °b. RETURN TO WORK:  ______________________________________________________________________________________ °9. You should see your doctor in the office for a follow-up appointment approximately two weeks after your surgery.  Your doctor’s nurse will typically make your follow-up appointment when she calls you with your pathology report.  Expect your pathology report 2-3 business days after your surgery.  You may call to check if you do not hear from us after three days. °10. OTHER INSTRUCTIONS: _______________________________________________________________________________________________ _____________________________________________________________________________________________________________________________________ °_____________________________________________________________________________________________________________________________________ °_____________________________________________________________________________________________________________________________________ ° °WHEN TO CALL YOUR DOCTOR: °1. Fever over 101.0 °2. Nausea and/or vomiting. °3. Extreme swelling or bruising. °4. Continued bleeding from incision. °5. Increased pain, redness, or drainage from the incision. ° °The clinic staff is available to answer your questions during regular business hours.  Please don’t hesitate to call and ask to speak to one of the nurses for clinical concerns.  If you have a medical emergency, go to the nearest  emergency room or call 911.  A surgeon from [facility] is always on call at the hospital. ° °For further questions, please visit [email or web address]  °

## 2020-08-05 ENCOUNTER — Encounter (HOSPITAL_COMMUNITY): Payer: Self-pay | Admitting: Surgery

## 2020-08-10 ENCOUNTER — Encounter: Payer: Self-pay | Admitting: *Deleted

## 2020-08-10 LAB — SURGICAL PATHOLOGY

## 2020-08-11 ENCOUNTER — Telehealth: Payer: Self-pay | Admitting: Hematology and Oncology

## 2020-08-11 NOTE — Telephone Encounter (Signed)
Informed patient of her upcoming appointment. Patient is aware. °

## 2020-08-12 ENCOUNTER — Encounter: Payer: Self-pay | Admitting: *Deleted

## 2020-08-18 NOTE — Progress Notes (Signed)
HEMATOLOGY-ONCOLOGY MYCHART VIDEO VISIT PROGRESS NOTE  I connected with Laura Weiss on 08/19/2020 at  1:15 PM EST by MyChart video conference and verified that I am speaking with the correct person using two identifiers.  I discussed the limitations, risks, security and privacy concerns of performing an evaluation and management service by MyChart and the availability of in person appointments.  I also discussed with the patient that there may be a patient responsible charge related to this service. The patient expressed understanding and agreed to proceed.  Patient's Location: Home Physician Location: Clinic  CHIEF COMPLIANT: Follow-up s/p lumpectomy to review pathology   INTERVAL HISTORY: Laura Weiss is a 50 y.o. female with above-mentioned history of right breast DCIS. She underwent a right lumpectomy on 08/04/20 with Dr. Brantley Stage for which pathology showed intermediate grade DCIS, 1.5cm, clear margins. She presents over MyChart today to discuss the pathology report and further treatment.   Oncology History  Ductal carcinoma in situ of right breast  07/14/2020 Initial Diagnosis   Screening mammogram showed upper right breast calcifications, 0.5cm. Biopsy showed high grade DCIS, ER 90% weak to moderate staining, PR+ 2%.   07/24/2020 Genetic Testing   Negative genetic testing:  No pathogenic variants detected on the [facility] Breast Cancer STAT panel or Common Hereditary Cancers panel. A variant of uncertain significance (VUS) was detected in the POLD1 gene called c.202+3A>G (Intronic). The report date is 07/24/2020.  The Breast Cancer STAT Panel offered by [facility] includes sequencing and deletion/duplication analysis for the following 9 genes:  ATM, BRCA1, BRCA2, CDH1, CHEK2, PALB2, PTEN, STK11 and TP53. The Common Hereditary Cancers Panel offered by [facility] includes sequencing and/or deletion duplication testing of the following 48 genes: APC, ATM, AXIN2, BARD1, BMPR1A, BRCA1, BRCA2, BRIP1,  CDH1, CDK4, CDKN2A (p14ARF), CDKN2A (p16INK4a), CHEK2, CTNNA1, DICER1, EPCAM (Deletion/duplication testing only), GREM1 (promoter region deletion/duplication testing only), KIT, MEN1, MLH1, MSH2, MSH3, MSH6, MUTYH, NBN, NF1, NTHL1, PALB2, PDGFRA, PMS2, POLD1, POLE, PTEN, RAD50, RAD51C, RAD51D, RNF43, SDHB, SDHC, SDHD, SMAD4, SMARCA4. STK11, TP53, TSC1, TSC2, and VHL.  The following genes were evaluated for sequence changes only: SDHA and HOXB13 c.251G>A variant only.   08/04/2020 Surgery   Right lumpectomy ([facility]): intermediate grade DCIS, 1.5cm, clear margins.     Observations/Objective:  There were no vitals filed for this visit. There is no height or weight on file to calculate BMI.  I have reviewed the data as listed CMP Latest Ref Rng & Units 07/29/2020  Glucose 70 - 99 mg/dL 106(H)  BUN 6 - 20 mg/dL 10  Creatinine 0.44 - 1.00 mg/dL 0.86  Sodium 135 - 145 mmol/L 138  Potassium 3.5 - 5.1 mmol/L 4.5  Chloride 98 - 111 mmol/L 105  CO2 22 - 32 mmol/L 25  Calcium 8.9 - 10.3 mg/dL 9.7    Lab Results  Component Value Date   WBC 7.2 07/29/2020   HGB 13.1 07/29/2020   HCT 37.5 07/29/2020   MCV 93.8 07/29/2020   PLT 263 07/29/2020      Assessment Plan:  Ductal carcinoma in situ of right breast 08/04/20: Right lumpectomy ([facility]): intermediate grade DCIS, 1.5cm, clear margins.ER 90% weak to moderate staining, PR+ 2%.  Pathology counseling: I discussed the final pathology report of the patient provided  a copy of this report. I discussed the margins  We also discussed the final staging along with previously performed ER/PR  testing.  Treatment Plan: 1. adjuvant radiation therapy 2. Followed by antiestrogen therapy   RTC after XRT is  complete  I discussed the assessment and treatment plan with the patient. The patient was provided an opportunity to ask questions and all were answered. The patient agreed with the plan and demonstrated an understanding of the instructions. The  patient was advised to call back or seek an in-person evaluation if the symptoms worsen or if the condition fails to improve as anticipated.   Total time spent: 20 minutes including face-to-face MyChart video visit time and time spent for planning, charting and coordination of care  Rulon Eisenmenger, MD 08/19/2020   I, Cloyde Reams Dorshimer, am acting as scribe for Nicholas Lose, MD.  I have reviewed the above documentation for accuracy and completeness, and I agree with the above.

## 2020-08-19 ENCOUNTER — Inpatient Hospital Stay: Payer: BC Managed Care – PPO | Attending: Hematology and Oncology | Admitting: Hematology and Oncology

## 2020-08-19 DIAGNOSIS — D0511 Intraductal carcinoma in situ of right breast: Secondary | ICD-10-CM | POA: Diagnosis not present

## 2020-08-19 NOTE — Assessment & Plan Note (Signed)
08/04/20: Right lumpectomy ([facility]): intermediate grade DCIS, 1.5cm, clear margins.ER 90% weak to moderate staining, PR+ 2%.  Pathology counseling: I discussed the final pathology report of the patient provided  a copy of this report. I discussed the margins  We also discussed the final staging along with previously performed ER/PR  testing.  Treatment Plan: 1. adjuvant radiation therapy 2. Followed by antiestrogen therapy   RTC after XRT is complete

## 2020-08-31 NOTE — Progress Notes (Signed)
Radiation Oncology         [phone] ________________________________  Name: Laura Weiss MRN: [id number]  Date: 09/01/2020  DOB: Dec 03, 1970  Follow-Up Visit Note -  The patient opted for telemedicine to maximize safety during the pandemic.  MyChart video was used  Outpatient  CC: Laura Morning, DO  Nicholas Lose, MD  Diagnosis:      ICD-10-CM   1. Ductal carcinoma in situ of right breast  D05.11    Stage 0 RIGHT BREAST DCIS  CHIEF COMPLAINT: Here to discuss management of right breast DCIS  Narrative:  The patient returns today for follow-up. She was seen in consultation on 07/20/2020.   She has not undergone any significant imaging studies since consult.   Breast surgery on the date of 08/04/2020 revealed: tumor size of 1.5 cm; histology of ductal carcinoma in situ with central necrosis and calcifications; margin status negative by at least 2 mm; ER status: 90% weak-moderate; PR status: 2% moderate; Grade: 2.  Symptomatically, the patient reports: Some soreness in her breast which is exacerbated by her seatbelt.  She is working from home.  She plans to go on spring break with her daughter in the middle of April.        ALLERGIES:  has No Known Allergies.  Meds: Current Outpatient Medications  Medication Sig Dispense Refill   dicyclomine (BENTYL) 20 MG tablet Take 20 mg by mouth 4 (four) times daily as needed for spasms. (Patient not taking: Reported on 09/01/2020)     HYDROcodone-acetaminophen (NORCO/VICODIN) 5-325 MG tablet Take 1 tablet by mouth every 6 (six) hours as needed for moderate pain. (Patient not taking: Reported on 09/01/2020) 15 tablet 0   ibuprofen (ADVIL) 800 MG tablet Take 1 tablet (800 mg total) by mouth every 8 (eight) hours as needed. (Patient not taking: Reported on 09/01/2020) 30 tablet 0   mupirocin ointment (BACTROBAN) 2 % Apply 1 application topically daily as needed (Rash). (Patient not taking: Reported on 09/01/2020)     OVER THE COUNTER  MEDICATION Apply 1 application topically daily as needed (treat acne on face). Benzoyl Peroxide 2.5 % (Patient not taking: Reported on 09/01/2020)     Polyvinyl Alcohol-Povidone (CLEAR EYES ALL SEASONS) 5-6 MG/ML SOLN Place 1 drop into both eyes daily as needed (Red eyes). (Patient not taking: Reported on 09/01/2020)     No current facility-administered medications for this encounter.    Physical Findings:  vitals were not taken for this visit. .     General: Alert and oriented, in no acute distress   Lab Findings: Lab Results  Component Value Date   WBC 7.2 07/29/2020   HGB 13.1 07/29/2020   HCT 37.5 07/29/2020   MCV 93.8 07/29/2020   PLT 263 07/29/2020     Radiographic Findings: MM Breast Surgical Specimen  Result Date: 08/04/2020 CLINICAL DATA:  Evaluate surgical specimen following RIGHT lumpectomy for DCIS. EXAM: SPECIMEN RADIOGRAPH OF THE RIGHT BREAST COMPARISON:  Previous exam(s). FINDINGS: Status post excision of the RIGHT breast. The radioactive seed and biopsy marker clip are present, completely intact, and were marked for pathology. IMPRESSION: Specimen radiograph of the RIGHT breast. Electronically Signed   By: Margarette Canada M.D.   On: 08/04/2020 11:29   MM RT RADIOACTIVE SEED LOC MAMMO GUIDE  Result Date: 08/03/2020 CLINICAL DATA:  Patient presents for seed localization prior to RIGHT lumpectomy. Recent stereotactic guided core biopsy shows intermediate high-grade ductal carcinoma in situ. EXAM: MAMMOGRAPHIC GUIDED RADIOACTIVE SEED LOCALIZATION OF THE RIGHT BREAST  COMPARISON:  Previous exam(s). FINDINGS: Patient presents for radioactive seed localization prior to lumpectomy. I met with the patient and we discussed the procedure of seed localization including benefits and alternatives. We discussed the high likelihood of a successful procedure. We discussed the risks of the procedure including infection, bleeding, tissue injury and further surgery. We discussed the low dose of  radioactivity involved in the procedure. Informed, written consent was given. The usual time-out protocol was performed immediately prior to the procedure. Using mammographic guidance, sterile technique, 1% lidocaine and an I-125 radioactive seed, the coil shaped clip and adjacent residual calcifications are localized using a craniocaudal approach. The follow-up mammogram images confirm the seed in the expected location and were marked for Dr. Brantley Stage. Follow-up survey of the patient confirms presence of the radioactive seed. Order number of I-125 seed:  [id number]. Total activity:  0.814 millicuries reference Date: And 04/14/2020 The patient tolerated the procedure well and was released from the [facility]. She was given instructions regarding seed removal. IMPRESSION: Radioactive seed localization right breast. No apparent complications. Electronically Signed   By: Nolon Nations M.D.   On: 08/03/2020 15:24    Impression/Plan: Right breast DCIS  We discussed adjuvant radiotherapy today.  I recommend 4 weeks of adjuvant radiation therapy to the right breast in order to reduce risk of local recurrence by half.  I reviewed the logistics, benefits, risks, and potential side effects of this treatment in detail. Risks may include but not necessary be limited to acute and late injury tissue in the radiation fields such as skin irritation (change in color/pigmentation, itching, dryness, pain, peeling). She may experience fatigue. We also discussed possible risk of long term cosmetic changes or scar tissue. There is also a smaller risk for lung toxicity, lymphedema, musculoskeletal changes, rib fragility or induction of a second malignancy, late chronic non-healing soft tissue wound.    The patient asked good questions which I answered to her satisfaction. She is enthusiastic about proceeding with treatment.  A consent form will be signed later this week at her CT simulation.  Anticipate starting treatment in  early March.  She is pleased with this plan.   This encounter was provided by telemedicine platform; patient desired telemedicine during pandemic precautions.  MyChart video was used. The patient has given verbal consent for this type of encounter and has been advised to only accept a meeting of this type in a secure network environment. On date of service, in total, I spent 30 minutes on this encounter.   The attendants for this meeting [place] [facility] [employer] located at [facility] Radiation Oncology Department.  [name] was located at home.  _____________________________________   Eppie Gibson, MD  This document serves as a record of services personally performed by Eppie Gibson, MD. It was created on his behalf by Clerance Lav, a trained medical scribe. The creation of this record is based on the scribe's personal observations and the provider's statements to them. This document has been checked and approved by the attending provider.

## 2020-08-31 NOTE — Progress Notes (Signed)
Location of Breast Cancer: Ductal carcinoma in situ (DCIS) of RIGHT breast  Histology per Pathology Report: 08/04/2020 FINAL MICROSCOPIC DIAGNOSIS:  A. BREAST, RIGHT, LUMPECTOMY:  - Ductal carcinoma in situ, intermediate nuclear grade with central  necrosis and calcifications, spanning 1.5 cm.  - DCIS is < 1 mm from each the anterior and inferior margins, 1 mm from the lateral margin and  2 mm from each the posterior and medial margins.  - Biopsy site.  - See oncology table.  B. BREAST, RIGHT, ANTERIOR MARGIN, EXCISION:  - Breast tissue, negative for carcinoma.  C. BREAST, RIGHT, MEDIAL MARGIN, EXCISION:  - Breast tissue, negative for carcinoma.  D. BREAST, RIGHT, LATERAL MARGIN, EXCISION:  - Breast tissue, negative for carcinoma.  E. BREAST, RIGHT, SUPERIOR MARGIN, EXCISION:  - Breast tissue, negative for carcinoma.  F. BREAST, RIGHT, INFERIOR MARGIN, EXCISION:  - Breast tissue, negative for carcinoma.  Receptor Status: ER(90%), PR (2%)  Did patient present with symptoms (if so, please note symptoms) or was this found on screening mammography?: Screening mammogram on 07/14/2020 showed upper right breast calcifications, 0.5cm (around 12:00 position).   Past/Anticipated interventions by surgeon, if any: 08/04/2020 Dr. Erroll Luna Procedure right breast seed localized lumpectomy  Past/Anticipated interventions by medical oncology, if any:  Under care of Dr. Nicholas Lose 08/19/2020 --Treatment Plan: 1. adjuvant radiation therapy 2. Followed by antiestrogen therapy  --RTC after XRT is complete  Lymphedema issues, if any:  Patient denies    Pain issues, if any:  Patient denies   SAFETY ISSUES:  Prior radiation? No  Pacemaker/ICD? No  Possible current pregnancy? No--LMP 08/31/2020  Is the patient on methotrexate? No  Current Complaints / other details:  Patient has received J&J vaccine as well as [employer] booster

## 2020-09-01 ENCOUNTER — Other Ambulatory Visit: Payer: Self-pay

## 2020-09-01 ENCOUNTER — Ambulatory Visit
Admission: RE | Admit: 2020-09-01 | Discharge: 2020-09-01 | Disposition: A | Discharge status: Home / Self Care | Payer: BC Managed Care – PPO | Source: Ambulatory Visit | Attending: Radiation Oncology | Admitting: Radiation Oncology

## 2020-09-01 ENCOUNTER — Encounter: Payer: Self-pay | Admitting: Radiation Oncology

## 2020-09-01 DIAGNOSIS — D0511 Intraductal carcinoma in situ of right breast: Secondary | ICD-10-CM

## 2020-09-04 ENCOUNTER — Ambulatory Visit
Admission: RE | Admit: 2020-09-04 | Discharge: 2020-09-04 | Disposition: A | Discharge status: Home / Self Care | Payer: BC Managed Care – PPO | Source: Ambulatory Visit | Attending: Radiation Oncology | Admitting: Radiation Oncology

## 2020-09-04 ENCOUNTER — Other Ambulatory Visit: Payer: Self-pay

## 2020-09-04 DIAGNOSIS — D0511 Intraductal carcinoma in situ of right breast: Secondary | ICD-10-CM | POA: Diagnosis not present

## 2020-09-04 LAB — PREGNANCY, URINE: Preg Test, Ur: NEGATIVE

## 2020-09-08 DIAGNOSIS — D0511 Intraductal carcinoma in situ of right breast: Secondary | ICD-10-CM | POA: Diagnosis not present

## 2020-09-10 ENCOUNTER — Encounter: Payer: Self-pay | Admitting: *Deleted

## 2020-09-15 ENCOUNTER — Other Ambulatory Visit: Payer: Self-pay

## 2020-09-15 ENCOUNTER — Ambulatory Visit
Admission: RE | Admit: 2020-09-15 | Discharge: 2020-09-15 | Disposition: A | Discharge status: Home / Self Care | Payer: BC Managed Care – PPO | Source: Ambulatory Visit | Attending: Radiation Oncology | Admitting: Radiation Oncology

## 2020-09-15 DIAGNOSIS — D0511 Intraductal carcinoma in situ of right breast: Secondary | ICD-10-CM | POA: Diagnosis not present

## 2020-09-16 ENCOUNTER — Ambulatory Visit
Admission: RE | Admit: 2020-09-16 | Discharge: 2020-09-16 | Disposition: A | Discharge status: Home / Self Care | Payer: BC Managed Care – PPO | Source: Ambulatory Visit | Attending: Radiation Oncology | Admitting: Radiation Oncology

## 2020-09-16 DIAGNOSIS — D0511 Intraductal carcinoma in situ of right breast: Secondary | ICD-10-CM | POA: Diagnosis not present

## 2020-09-17 ENCOUNTER — Other Ambulatory Visit: Payer: Self-pay

## 2020-09-17 ENCOUNTER — Ambulatory Visit
Admission: RE | Admit: 2020-09-17 | Discharge: 2020-09-17 | Disposition: A | Discharge status: Home / Self Care | Payer: BC Managed Care – PPO | Source: Ambulatory Visit | Attending: Radiation Oncology | Admitting: Radiation Oncology

## 2020-09-17 DIAGNOSIS — D0511 Intraductal carcinoma in situ of right breast: Secondary | ICD-10-CM | POA: Diagnosis not present

## 2020-09-18 ENCOUNTER — Ambulatory Visit
Admission: RE | Admit: 2020-09-18 | Discharge: 2020-09-18 | Disposition: A | Discharge status: Home / Self Care | Payer: BC Managed Care – PPO | Source: Ambulatory Visit | Attending: Radiation Oncology | Admitting: Radiation Oncology

## 2020-09-18 DIAGNOSIS — D0511 Intraductal carcinoma in situ of right breast: Secondary | ICD-10-CM | POA: Diagnosis not present

## 2020-09-21 ENCOUNTER — Ambulatory Visit
Admission: RE | Admit: 2020-09-21 | Discharge: 2020-09-21 | Disposition: A | Discharge status: Home / Self Care | Payer: BC Managed Care – PPO | Source: Ambulatory Visit | Attending: Radiation Oncology | Admitting: Radiation Oncology

## 2020-09-21 ENCOUNTER — Encounter: Payer: Self-pay | Admitting: Genetic Counselor

## 2020-09-21 ENCOUNTER — Other Ambulatory Visit: Payer: Self-pay

## 2020-09-21 DIAGNOSIS — D0511 Intraductal carcinoma in situ of right breast: Secondary | ICD-10-CM

## 2020-09-21 MED ORDER — RADIAPLEXRX EX GEL: Freq: Once | CUTANEOUS | Status: AC

## 2020-09-21 MED ORDER — ALRA NON-METALLIC DEODORANT (RAD-ONC)
1.0000 "application " | Freq: Once | TOPICAL | Status: AC
  Administered 2020-09-21: 1 via TOPICAL

## 2020-09-21 NOTE — Progress Notes (Signed)
Pt here for patient teaching.  Completed by Donna McIntyre-RN  Pt given Radiation and You booklet, skin care instructions, Alra deodorant and Radiaplex gel.    Reviewed areas of pertinence such as fatigue, hair loss, skin changes, breast tenderness and breast swelling .   Pt able to give teach back of to pat skin, use unscented/gentle soap and drink plenty of water,apply Radiaplex bid, avoid applying anything to skin within 4 hours of treatment, avoid wearing an under wire bra and to use an electric razor if they must shave.   Pt demonstrated understanding and verbalizes understanding of information given and will contact nursing with any questions or concerns.    [email or web address]/treatmentinformation/whattoexpect/index          

## 2020-09-22 ENCOUNTER — Other Ambulatory Visit: Payer: Self-pay

## 2020-09-22 ENCOUNTER — Ambulatory Visit
Admission: RE | Admit: 2020-09-22 | Discharge: 2020-09-22 | Disposition: A | Discharge status: Home / Self Care | Payer: BC Managed Care – PPO | Source: Ambulatory Visit | Attending: Radiation Oncology | Admitting: Radiation Oncology

## 2020-09-22 ENCOUNTER — Encounter: Payer: Self-pay | Admitting: *Deleted

## 2020-09-22 ENCOUNTER — Telehealth: Payer: Self-pay | Admitting: Hematology and Oncology

## 2020-09-22 DIAGNOSIS — D0511 Intraductal carcinoma in situ of right breast: Secondary | ICD-10-CM | POA: Diagnosis not present

## 2020-09-22 NOTE — Telephone Encounter (Signed)
Scheduled appt per 3/14 sch msg. Pt aware.

## 2020-09-23 ENCOUNTER — Ambulatory Visit
Admission: RE | Admit: 2020-09-23 | Discharge: 2020-09-23 | Disposition: A | Discharge status: Home / Self Care | Payer: BC Managed Care – PPO | Source: Ambulatory Visit | Attending: Radiation Oncology | Admitting: Radiation Oncology

## 2020-09-23 DIAGNOSIS — D0511 Intraductal carcinoma in situ of right breast: Secondary | ICD-10-CM | POA: Diagnosis not present

## 2020-09-24 ENCOUNTER — Ambulatory Visit
Admission: RE | Admit: 2020-09-24 | Discharge: 2020-09-24 | Disposition: A | Discharge status: Home / Self Care | Payer: BC Managed Care – PPO | Source: Ambulatory Visit | Attending: Radiation Oncology | Admitting: Radiation Oncology

## 2020-09-24 ENCOUNTER — Other Ambulatory Visit: Payer: Self-pay

## 2020-09-24 DIAGNOSIS — D0511 Intraductal carcinoma in situ of right breast: Secondary | ICD-10-CM | POA: Diagnosis not present

## 2020-09-25 ENCOUNTER — Ambulatory Visit
Admission: RE | Admit: 2020-09-25 | Discharge: 2020-09-25 | Disposition: A | Discharge status: Home / Self Care | Payer: BC Managed Care – PPO | Source: Ambulatory Visit | Attending: Radiation Oncology | Admitting: Radiation Oncology

## 2020-09-25 ENCOUNTER — Other Ambulatory Visit: Payer: Self-pay

## 2020-09-25 DIAGNOSIS — D0511 Intraductal carcinoma in situ of right breast: Secondary | ICD-10-CM | POA: Diagnosis not present

## 2020-09-28 ENCOUNTER — Ambulatory Visit: Payer: BC Managed Care – PPO | Admitting: Radiation Oncology

## 2020-09-28 ENCOUNTER — Other Ambulatory Visit: Payer: Self-pay

## 2020-09-28 ENCOUNTER — Ambulatory Visit
Admission: RE | Admit: 2020-09-28 | Discharge: 2020-09-28 | Disposition: A | Discharge status: Home / Self Care | Payer: BC Managed Care – PPO | Source: Ambulatory Visit | Attending: Radiation Oncology | Admitting: Radiation Oncology

## 2020-09-28 DIAGNOSIS — D0511 Intraductal carcinoma in situ of right breast: Secondary | ICD-10-CM | POA: Diagnosis not present

## 2020-09-29 ENCOUNTER — Ambulatory Visit
Admission: RE | Admit: 2020-09-29 | Discharge: 2020-09-29 | Disposition: A | Discharge status: Home / Self Care | Payer: BC Managed Care – PPO | Source: Ambulatory Visit | Attending: Radiation Oncology | Admitting: Radiation Oncology

## 2020-09-29 DIAGNOSIS — D0511 Intraductal carcinoma in situ of right breast: Secondary | ICD-10-CM | POA: Diagnosis not present

## 2020-09-30 ENCOUNTER — Ambulatory Visit
Admission: RE | Admit: 2020-09-30 | Discharge: 2020-09-30 | Disposition: A | Discharge status: Home / Self Care | Payer: BC Managed Care – PPO | Source: Ambulatory Visit | Attending: Radiation Oncology | Admitting: Radiation Oncology

## 2020-09-30 ENCOUNTER — Other Ambulatory Visit: Payer: Self-pay

## 2020-09-30 DIAGNOSIS — D0511 Intraductal carcinoma in situ of right breast: Secondary | ICD-10-CM | POA: Diagnosis not present

## 2020-10-01 ENCOUNTER — Ambulatory Visit
Admission: RE | Admit: 2020-10-01 | Discharge: 2020-10-01 | Disposition: A | Discharge status: Home / Self Care | Payer: BC Managed Care – PPO | Source: Ambulatory Visit | Attending: Radiation Oncology | Admitting: Radiation Oncology

## 2020-10-01 DIAGNOSIS — D0511 Intraductal carcinoma in situ of right breast: Secondary | ICD-10-CM | POA: Diagnosis not present

## 2020-10-02 ENCOUNTER — Other Ambulatory Visit: Payer: Self-pay

## 2020-10-02 ENCOUNTER — Ambulatory Visit
Admission: RE | Admit: 2020-10-02 | Discharge: 2020-10-02 | Disposition: A | Discharge status: Home / Self Care | Payer: BC Managed Care – PPO | Source: Ambulatory Visit | Attending: Radiation Oncology | Admitting: Radiation Oncology

## 2020-10-02 DIAGNOSIS — D0511 Intraductal carcinoma in situ of right breast: Secondary | ICD-10-CM | POA: Diagnosis not present

## 2020-10-05 ENCOUNTER — Ambulatory Visit
Admission: RE | Admit: 2020-10-05 | Discharge: 2020-10-05 | Disposition: A | Discharge status: Home / Self Care | Payer: BC Managed Care – PPO | Source: Ambulatory Visit | Attending: Radiation Oncology | Admitting: Radiation Oncology

## 2020-10-05 ENCOUNTER — Other Ambulatory Visit: Payer: Self-pay

## 2020-10-05 ENCOUNTER — Telehealth: Payer: Self-pay | Admitting: *Deleted

## 2020-10-05 DIAGNOSIS — D0511 Intraductal carcinoma in situ of right breast: Secondary | ICD-10-CM | POA: Diagnosis not present

## 2020-10-05 NOTE — Telephone Encounter (Signed)
XXXXX

## 2020-10-05 NOTE — Assessment & Plan Note (Signed)
08/04/20: Right lumpectomy ([facility]): intermediate grade DCIS, 1.5cm, clear margins.ER 90% weak to moderate staining, PR+ 2%.  Treatment Plan: 1. adjuvant radiation therapy 09/16/20- 10/12/20 2. Followed by antiestrogen therapy   Anastrozole counseling: We discussed the risks and benefits of anti-estrogen therapy with aromatase inhibitors. These include but not limited to insomnia, hot flashes, mood changes, vaginal dryness, bone density loss, and weight gain. We strongly believe that the benefits far outweigh the risks. Patient understands these risks and consented to starting treatment. Planned treatment duration is 5-7 years.

## 2020-10-05 NOTE — Progress Notes (Signed)
Patient Care Team: [name], DO as PCP - General (Family Medicine) Mauro Kaufmann, RN as Oncology Nurse Navigator Rockwell Germany, RN as Oncology Nurse Navigator  DIAGNOSIS:    ICD-10-CM   1. 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 Biopsy showed high grade DCIS, ER 90% weak to moderate staining, PR+ 2%.   07/24/2020 Genetic Testing   Negative genetic testing:  No pathogenic variants detected on the [facility] Breast Cancer STAT panel or Common Hereditary Cancers panel. A variant of uncertain significance (VUS) was detected in the POLD1 gene called c.202+3A>G (Intronic). The report date is 07/24/2020.  UPDATE:  The POLD1 c.202+3A>G (Intronic) VUS was reclassified to "Likely Benign" on 09/14/2020. The change in variant classification was made as a result of re-review of the evidence in light of new variant interpretation guidelines and/or new information.  The Breast Cancer STAT Panel offered by [facility] includes sequencing and deletion/duplication analysis for the following 9 genes:  ATM, BRCA1, BRCA2, CDH1, CHEK2, PALB2, PTEN, STK11 and TP53. The Common Hereditary Cancers Panel offered by [facility] includes sequencing and/or deletion duplication testing of the following 48 genes: APC, ATM, AXIN2, BARD1, BMPR1A, BRCA1, BRCA2, BRIP1, CDH1, CDK4, CDKN2A (p14ARF), CDKN2A (p16INK4a), CHEK2, CTNNA1, DICER1, EPCAM (Deletion/duplication testing only), GREM1 (promoter region deletion/duplication testing only), KIT, MEN1, MLH1, MSH2, MSH3, MSH6, MUTYH, NBN, NF1, NTHL1, PALB2, PDGFRA, PMS2, POLD1, POLE, PTEN, RAD50, RAD51C, RAD51D, RNF43, SDHB, SDHC, SDHD, SMAD4, SMARCA4. STK11, TP53, TSC1, TSC2, and VHL.  The following genes were evaluated for sequence changes only: SDHA and HOXB13 c.251G>A variant only.    08/04/2020 Surgery   Right lumpectomy ([facility]): intermediate grade DCIS, 1.5cm, clear margins.   09/16/2020 -  Radiation Therapy   Adjuvant radiation     CHIEF COMPLIANT: Follow-up to discuss antiestrogen therapy  INTERVAL HISTORY: Laura Weiss is a 50 y.o. with above-mentioned history of right breast DCIS who underwent a right lumpectomy and is currently undergoing radiation. She presents to the clinic today to discuss antiestrogen therapy.  She is tolerating radiation fairly well without any problems or concerns.  She has mild radiation dermatitis.  ALLERGIES:  has No Known Allergies.  MEDICATIONS:  Current Outpatient Medications  Medication Sig Dispense Refill  . dicyclomine (BENTYL) 20 MG tablet Take 20 mg by mouth 4 (four) times daily as needed for spasms. (Patient not taking: Reported on 09/01/2020)    . HYDROcodone-acetaminophen (NORCO/VICODIN) 5-325 MG tablet Take 1 tablet by mouth every 6 (six) hours as needed for moderate pain. (Patient not taking: Reported on 09/01/2020) 15 tablet 0  . ibuprofen (ADVIL) 800 MG tablet Take 1 tablet (800 mg total) by mouth every 8 (eight) hours as needed. (Patient not taking: Reported on 09/01/2020) 30 tablet 0  . mupirocin ointment (BACTROBAN) 2 % Apply 1 application topically daily as needed (Rash). (Patient not taking: Reported on 09/01/2020)    . OVER THE COUNTER MEDICATION Apply 1 application topically daily as needed (treat acne on face). Benzoyl Peroxide 2.5 % (Patient not taking: Reported on 09/01/2020)    . Polyvinyl Alcohol-Povidone (CLEAR EYES ALL SEASONS) 5-6 MG/ML SOLN Place 1 drop into both eyes daily as needed (Red eyes). (Patient not taking: Reported on 09/01/2020)     No current facility-administered medications for this visit.    PHYSICAL EXAMINATION: ECOG PERFORMANCE STATUS:  1 - Symptomatic but completely ambulatory  Vitals:   10/06/20 1445  BP: 124/66  Pulse: 83  Resp: 18  Temp: 98.6 F (37 C)  SpO2: 100%   Filed  Weights   10/06/20 1445  Weight: 211 lb 11.2 oz (96 kg)     LABORATORY DATA:  I have reviewed the data as listed CMP Latest Ref Rng & Units 07/29/2020  Glucose 70 - 99 mg/dL 106(H)  BUN 6 - 20 mg/dL 10  Creatinine 0.44 - 1.00 mg/dL 0.86  Sodium 135 - 145 mmol/L 138  Potassium 3.5 - 5.1 mmol/L 4.5  Chloride 98 - 111 mmol/L 105  CO2 22 - 32 mmol/L 25  Calcium 8.9 - 10.3 mg/dL 9.7    Lab Results  Component Value Date   WBC 7.2 07/29/2020   HGB 13.1 07/29/2020   HCT 37.5 07/29/2020   MCV 93.8 07/29/2020   PLT 263 07/29/2020    ASSESSMENT & PLAN:  Ductal carcinoma in situ of right breast 08/04/20: Right lumpectomy ([facility]): intermediate grade DCIS, 1.5cm, clear margins.ER 90% weak to moderate staining, PR+ 2%.  Treatment Plan: 1. adjuvant radiation therapy 09/16/20- 10/12/20 2. Followed by antiestrogen therapy   Tamoxifen counseling: We discussed the risks and benefits of tamoxifen. These include but not limited to insomnia, hot flashes, mood changes, vaginal dryness, and weight gain. Although rare, serious side effects including endometrial cancer, risk of blood clots were also discussed. We strongly believe that the benefits far outweigh the risks. Patient understands these risks and consented to starting treatment. Planned treatment duration is 5 years.  Return to clinic in 3 months for survivorship care plan visit  No orders of the defined types were placed in this encounter.  The patient has a good understanding of the overall plan. she agrees with it. she will call with any problems that may develop before the next visit here.  Total time spent: 30 mins including face to face time and time spent for planning, charting and coordination of care  Vinay K Gudena, MD, MPH 10/06/2020  I, Molly Dorshimer, am acting as scribe for Dr. Vinay Gudena.  I have reviewed the above documentation for accuracy and completeness, and I agree with the above.       

## 2020-10-06 ENCOUNTER — Ambulatory Visit
Admission: RE | Admit: 2020-10-06 | Discharge: 2020-10-06 | Disposition: A | Discharge status: Home / Self Care | Payer: BC Managed Care – PPO | Source: Ambulatory Visit | Attending: Radiation Oncology | Admitting: Radiation Oncology

## 2020-10-06 ENCOUNTER — Inpatient Hospital Stay: Payer: BC Managed Care – PPO | Attending: Hematology and Oncology | Admitting: Hematology and Oncology

## 2020-10-06 DIAGNOSIS — Z923 Personal history of irradiation: Secondary | ICD-10-CM | POA: Insufficient documentation

## 2020-10-06 DIAGNOSIS — D0511 Intraductal carcinoma in situ of right breast: Secondary | ICD-10-CM

## 2020-10-06 DIAGNOSIS — Z79899 Other long term (current) drug therapy: Secondary | ICD-10-CM | POA: Insufficient documentation

## 2020-10-06 MED ORDER — TAMOXIFEN CITRATE 20 MG PO TABS: 20.0000 mg | ORAL_TABLET | Freq: Every day | ORAL | 3 refills | Status: DC

## 2020-10-07 ENCOUNTER — Ambulatory Visit
Admission: RE | Admit: 2020-10-07 | Discharge: 2020-10-07 | Disposition: A | Discharge status: Home / Self Care | Payer: BC Managed Care – PPO | Source: Ambulatory Visit | Attending: Radiation Oncology | Admitting: Radiation Oncology

## 2020-10-07 ENCOUNTER — Other Ambulatory Visit: Payer: Self-pay

## 2020-10-07 DIAGNOSIS — D0511 Intraductal carcinoma in situ of right breast: Secondary | ICD-10-CM | POA: Diagnosis not present

## 2020-10-08 ENCOUNTER — Ambulatory Visit
Admission: RE | Admit: 2020-10-08 | Discharge: 2020-10-08 | Disposition: A | Discharge status: Home / Self Care | Payer: BC Managed Care – PPO | Source: Ambulatory Visit | Attending: Radiation Oncology | Admitting: Radiation Oncology

## 2020-10-08 DIAGNOSIS — D0511 Intraductal carcinoma in situ of right breast: Secondary | ICD-10-CM | POA: Diagnosis not present

## 2020-10-09 ENCOUNTER — Ambulatory Visit
Admission: RE | Admit: 2020-10-09 | Discharge: 2020-10-09 | Disposition: A | Discharge status: Home / Self Care | Payer: BC Managed Care – PPO | Source: Ambulatory Visit | Attending: Radiation Oncology | Admitting: Radiation Oncology

## 2020-10-09 ENCOUNTER — Other Ambulatory Visit: Payer: Self-pay

## 2020-10-09 DIAGNOSIS — D0511 Intraductal carcinoma in situ of right breast: Secondary | ICD-10-CM | POA: Insufficient documentation

## 2020-10-12 ENCOUNTER — Other Ambulatory Visit: Payer: Self-pay

## 2020-10-12 ENCOUNTER — Encounter: Payer: Self-pay | Admitting: Radiation Oncology

## 2020-10-12 ENCOUNTER — Ambulatory Visit
Admission: RE | Admit: 2020-10-12 | Discharge: 2020-10-12 | Disposition: A | Discharge status: Home / Self Care | Payer: BC Managed Care – PPO | Source: Ambulatory Visit | Attending: Radiation Oncology | Admitting: Radiation Oncology

## 2020-10-12 ENCOUNTER — Encounter: Payer: Self-pay | Admitting: *Deleted

## 2020-10-12 DIAGNOSIS — D0511 Intraductal carcinoma in situ of right breast: Secondary | ICD-10-CM | POA: Diagnosis not present

## 2020-10-29 ENCOUNTER — Telehealth: Payer: Self-pay | Admitting: *Deleted

## 2020-10-29 NOTE — Telephone Encounter (Signed)
Connected with KENNEDE LUSK [phone]) on radiotherapy side effect status update and questions per [employer] "Physical Capacity" and "Return to Work" forms awaiting provider signature.  .  Return to work date or need to continue work from home.  Type of work for accommodations beyond "Lymphedema Precautions" and treatment flare ups.  Any reductions needed with hours expected to work.    "May return to work Monday (11/02/2020).  Forget the forms.  Completed radiation, do not think there are needed. Authorization to return not needed.  Did not submit out of work request.  No one has informed me of risk of lymphedema or precautions.  I do type at work but not excessively.  Where does lymphedema occur?"  Lymph fluid circulation changes causes fluid collecting known as lymphedema.  Occurs on side of lumpectomy where lymph nodes have been removed.  Swelling amount, location and pain varies.  This nurse receives reports of lymphedema developing to arm, hand, breast and armpit more frequently.  Notify medical oncology of changes to lumpectomy site or surrounding areas for further evaluation.    Call [facility] if forms are needed.  This nurse keeping forms for provider signature pending notice of need.  Denies further questions or needs.   Next Radiation follow up scheduled 11/11/2020 with Medical Oncology "Survivorship Care Plan" visit 01/07/2021.

## 2020-11-11 ENCOUNTER — Ambulatory Visit: Payer: BC Managed Care – PPO | Admitting: Radiation Oncology

## 2020-11-11 NOTE — Telephone Encounter (Signed)
Patients 11/11/2020 radiation appointment canceled per Laura Weiss.  See today's radiation collaborative nurse phone encounter note.  Laura Weiss will follow up on as needed basis with radiation oncology.  Form completion cancelled as patient previously stated "No longer needed".  This nurse will not keep or perform further activity of the [place] "Physical Capacity Form" received via e-mail to [email or web address].

## 2020-12-02 NOTE — Progress Notes (Signed)
  Patient Name: Laura Weiss MRN: [id number] DOB: [birth year] Referring Physician: [name] (Profile Not Attached) Date of Service: 10/12/2020 [facility], [place]                                                        End Of Treatment Note  Diagnoses: D05.11-Intraductal carcinoma in situ of right breast  Cancer Staging: Cancer Staging Ductal carcinoma in situ of right breast Staging form: Breast, AJCC 8th Edition - Clinical: Stage 0 (cTis (DCIS), cN0, cM0) - Signed by Gardenia Phlegm, NP on 11/30/2020  Stage 0 RIGHT BREAST DCIS  Intent: Curative  Radiation Treatment Dates: 09/15/2020 through 10/12/2020 Site Technique Total Dose (Gy) Dose per Fx (Gy) Completed Fx Beam Energies  Breast, Right: Breast_Rt 3D 40.05/40.05 2.67 15/15 10X  Breast, Right: Breast_Rt_Bst 3D 10/10 2 5/5 6X, 10X   Narrative: The patient tolerated radiation therapy relatively well.   Plan: The patient will follow-up with radiation oncology in 1 mo or as needed.   -----------------------------------  Eppie Gibson, MD

## 2021-01-07 ENCOUNTER — Other Ambulatory Visit: Payer: Self-pay

## 2021-01-07 ENCOUNTER — Inpatient Hospital Stay: Payer: BC Managed Care – PPO | Attending: Adult Health | Admitting: Adult Health

## 2021-01-07 VITALS — BP 141/96 | HR 94 | Temp 98.2°F | Resp 18 | Wt 213.4 lb

## 2021-01-07 DIAGNOSIS — Z170 Estrogen receptor positive status [ER+]: Secondary | ICD-10-CM | POA: Insufficient documentation

## 2021-01-07 DIAGNOSIS — D0511 Intraductal carcinoma in situ of right breast: Secondary | ICD-10-CM | POA: Diagnosis not present

## 2021-01-07 DIAGNOSIS — Z923 Personal history of irradiation: Secondary | ICD-10-CM | POA: Diagnosis not present

## 2021-01-07 DIAGNOSIS — Z8052 Family history of malignant neoplasm of bladder: Secondary | ICD-10-CM | POA: Diagnosis not present

## 2021-01-07 DIAGNOSIS — Z800 Family history of malignant neoplasm of digestive organs: Secondary | ICD-10-CM | POA: Insufficient documentation

## 2021-01-07 DIAGNOSIS — Z808 Family history of malignant neoplasm of other organs or systems: Secondary | ICD-10-CM | POA: Diagnosis not present

## 2021-01-07 DIAGNOSIS — Z803 Family history of malignant neoplasm of breast: Secondary | ICD-10-CM | POA: Diagnosis not present

## 2021-01-07 NOTE — Progress Notes (Signed)
SURVIVORSHIP VISIT:    BRIEF ONCOLOGIC HISTORY:  Oncology History  Ductal carcinoma in situ of right breast  07/14/2020 Initial Diagnosis   Screening mammogram showed upper right breast calcifications, 0.5cm. Biopsy showed high grade DCIS, ER 90% weak to moderate staining, PR+ 2%.   07/24/2020 Genetic Testing   Negative genetic testing:  No pathogenic variants detected on the [facility] Breast Cancer STAT panel or Common Hereditary Cancers panel. A variant of uncertain significance (VUS) was detected in the POLD1 gene called c.202+3A>G (Intronic). The report date is 07/24/2020.  UPDATE:  The POLD1 c.202+3A>G (Intronic) VUS was reclassified to "Likely Benign" on 09/14/2020. The change in variant classification was made as a result of re-review of the evidence in light of new variant interpretation guidelines and/or new information.  The Breast Cancer STAT Panel offered by [facility] includes sequencing and deletion/duplication analysis for the following 9 genes:  ATM, BRCA1, BRCA2, CDH1, CHEK2, PALB2, PTEN, STK11 and TP53. The Common Hereditary Cancers Panel offered by [facility] includes sequencing and/or deletion duplication testing of the following 48 genes: APC, ATM, AXIN2, BARD1, BMPR1A, BRCA1, BRCA2, BRIP1, CDH1, CDK4, CDKN2A (p14ARF), CDKN2A (p16INK4a), CHEK2, CTNNA1, DICER1, EPCAM (Deletion/duplication testing only), GREM1 (promoter region deletion/duplication testing only), KIT, MEN1, MLH1, MSH2, MSH3, MSH6, MUTYH, NBN, NF1, NTHL1, PALB2, PDGFRA, PMS2, POLD1, POLE, PTEN, RAD50, RAD51C, RAD51D, RNF43, SDHB, SDHC, SDHD, SMAD4, SMARCA4. STK11, TP53, TSC1, TSC2, and VHL.  The following genes were evaluated for sequence changes only: SDHA and HOXB13 c.251G>A variant only.   08/04/2020 Surgery   Right lumpectomy ([facility]): intermediate grade DCIS, 1.5cm, clear margins.   09/15/2020 - 10/12/2020 Radiation Therapy   Site Technique Total Dose (Gy) Dose per Fx (Gy) Completed Fx Beam Energies  Breast, Right:  Breast_Rt 3D 40.05/40.05 2.67 15/15 10X  Breast, Right: Breast_Rt_Bst 3D 10/10 2 5/5 6X, 10X     10/2020 -  Anti-estrogen oral therapy   Tamoxifen daily   11/30/2020 Cancer Staging   Staging form: Breast, AJCC 8th Edition - Clinical: Stage 0 (cTis (DCIS), cN0, cM0) - Signed by Gardenia Phlegm, NP on 11/30/2020      INTERVAL HISTORY:  Ms. Christenbury to review her survivorship care plan detailing her treatment course for breast cancer, as well as monitoring long-term side effects of that treatment, education regarding health maintenance, screening, and overall wellness and health promotion.     Overall, Ms. Muhlestein reports feeling quite well.  She is taking tamoxifen daily and tolerates it well.  She notes that since her surgery when she is lying down or sleeping the fingers in her right hand will be numb, but it resolves after waking up and getting up.  Otherwise, she is doing quite well.    REVIEW OF SYSTEMS:  Review of Systems  Constitutional:  Negative for appetite change, chills, fatigue, fever and unexpected weight change.  HENT:   Negative for hearing loss, lump/mass and trouble swallowing.   Eyes:  Negative for eye problems and icterus.  Respiratory:  Negative for chest tightness, cough and shortness of breath.   Cardiovascular:  Negative for chest pain, leg swelling and palpitations.  Gastrointestinal:  Negative for abdominal distention, abdominal pain, constipation, diarrhea, nausea and vomiting.  Endocrine: Negative for hot flashes.  Genitourinary:  Negative for difficulty urinating.   Musculoskeletal:  Negative for arthralgias.  Skin:  Negative for itching and rash.  Neurological:  Negative for dizziness, extremity weakness, headaches and numbness.  Hematological:  Negative for adenopathy. Does not bruise/bleed easily.  Psychiatric/Behavioral:  Negative for depression.  The patient is not nervous/anxious.   Breast: Denies any new nodularity, masses, tenderness, nipple  changes, or nipple discharge.      ONCOLOGY TREATMENT TEAM:  1. Surgeon:  Dr. Brantley Stage at [facility] 2. Medical Oncologist: Dr. Lindi Adie  3. Radiation Oncologist: Dr. Isidore Moos    PAST MEDICAL/SURGICAL HISTORY:  Past Medical History:  Diagnosis Date   Family history of bladder cancer    Family history of breast cancer    Family history of melanoma    Family history of pancreatic cancer    Family history of rectal cancer    Past Surgical History:  Procedure Laterality Date   BREAST EXCISIONAL BIOPSY Right 2003   atypical hyperplasia   BREAST LUMPECTOMY WITH RADIOACTIVE SEED LOCALIZATION Right 08/04/2020   Procedure: RIGHT BREAST LUMPECTOMY WITH RADIOACTIVE SEED LOCALIZATION;  Surgeon: Erroll Luna, MD;  Location: [facility];  Service: General;  Laterality: Right;   elective ab       ALLERGIES:  No Known Allergies   CURRENT MEDICATIONS:  Outpatient Encounter Medications as of 01/07/2021  Medication Sig   dicyclomine (BENTYL) 20 MG tablet Take 20 mg by mouth 4 (four) times daily as needed for spasms. (Patient not taking: Reported on 09/01/2020)   HYDROcodone-acetaminophen (NORCO/VICODIN) 5-325 MG tablet Take 1 tablet by mouth every 6 (six) hours as needed for moderate pain. (Patient not taking: Reported on 09/01/2020)   ibuprofen (ADVIL) 800 MG tablet Take 1 tablet (800 mg total) by mouth every 8 (eight) hours as needed. (Patient not taking: Reported on 09/01/2020)   mupirocin ointment (BACTROBAN) 2 % Apply 1 application topically daily as needed (Rash). (Patient not taking: Reported on 09/01/2020)   OVER THE COUNTER MEDICATION Apply 1 application topically daily as needed (treat acne on face). Benzoyl Peroxide 2.5 % (Patient not taking: Reported on 09/01/2020)   Polyvinyl Alcohol-Povidone (CLEAR EYES ALL SEASONS) 5-6 MG/ML SOLN Place 1 drop into both eyes daily as needed (Red eyes). (Patient not taking: Reported on 09/01/2020)   tamoxifen (NOLVADEX) 20 MG tablet Take 1 tablet  (20 mg total) by mouth daily.   No facility-administered encounter medications on file as of 01/07/2021.     ONCOLOGIC FAMILY HISTORY:  Family History  Problem Relation Age of Onset   Breast cancer Mother 40   Bladder Cancer Mother 57   Pancreatic cancer Mother 45   Breast cancer Paternal Grandmother        dx 54s/80s   Melanoma Maternal Grandfather        multiple dx 35s, sun exposure   Rectal cancer Other        dx late 56s, father's cousin     GENETIC COUNSELING/TESTING: See above  SOCIAL HISTORY:  Social History   Socioeconomic History   Marital status: Married    Spouse name: Not on file   Number of children: Not on file   Years of education: Not on file   Highest education level: Not on file  Occupational History   Not on file  Tobacco Use   Smoking status: Never   Smokeless tobacco: Never  Vaping Use   Vaping Use: Never used  Substance and Sexual Activity   Alcohol use: Yes    Comment: daily   Drug use: Never   Sexual activity: Yes    Birth control/protection: None  Other Topics Concern   Not on file  Social History Narrative   Not on file   Social Determinants of Health   Financial Resource Strain: Not on file  Food Insecurity: Not on file  Transportation Needs: Not on file  Physical Activity: Not on file  Stress: Not on file  Social Connections: Not on file  Intimate Partner Violence: Not on file     OBSERVATIONS/OBJECTIVE:  BP (!) 141/96 (BP Location: Left Arm, Patient Position: Sitting)   Pulse 94   Temp 98.2 F (36.8 C) (Tympanic)   Resp 18   Wt 213 lb 6.4 oz (96.8 kg)   SpO2 100%   BMI 32.45 kg/m  GENERAL: Patient is a well appearing female in no acute distress HEENT:  Sclerae anicteric.  Mask in place. Neck is supple.  NODES:  No cervical, supraclavicular, or axillary lymphadenopathy palpated.  BREAST EXAM: right breast s/p lumpectomy and radiation, no sign of local recurrence, left breast benign.   LUNGS:  Clear to  auscultation bilaterally.  No wheezes or rhonchi. HEART:  Regular rate and rhythm. No murmur appreciated. ABDOMEN:  Soft, nontender.  Positive, normoactive bowel sounds. No organomegaly palpated. MSK:  No focal spinal tenderness to palpation. Full range of motion bilaterally in the upper extremities. EXTREMITIES:  No peripheral edema.   SKIN:  Clear with no obvious rashes or skin changes. No nail dyscrasia. NEURO:  Nonfocal. Well oriented.  Appropriate affect.   LABORATORY DATA:  None for this visit.  DIAGNOSTIC IMAGING:  None for this visit.      ASSESSMENT AND PLAN:  Ms.. Baka is a pleasant 50 y.o. female with Stage 0 right breast DCIS, ER+/PR+, diagnosed in 07/2020, treated with lumpectomy, adjuvant radiation therapy, and anti-estrogen therapy with Tamoxifen beginning in 10/2020.  She presents to the Survivorship Clinic for our initial meeting and routine follow-up post-completion of treatment for breast cancer.    1. Stage 0 right breast cancer:  Ms. Romaniello is continuing to recover from definitive treatment for breast cancer. She will follow-up with her medical oncologist, Dr. Lindi Adie in 6 months with history and physical exam per surveillance protocol.  She will continue her anti-estrogen therapy with Tamoxifen. Thus far, she is tolerating the Tamoxifen well, with minimal side effects. The numbness she is experiencing is normal and likely related to position and surgery.  She was instructed to make Dr. Lindi Adie or myself aware if she begins to experience any worsening side effects of the medication and I could see her back in clinic to help manage those side effects, as needed. Her mammogram is due 05/2021; orders placed today. Today, a comprehensive survivorship care plan and treatment summary was reviewed with the patient today detailing her breast cancer diagnosis, treatment course, potential late/long-term effects of treatment, appropriate follow-up care with recommendations for the  future, and patient education resources.  A copy of this summary, along with a letter will be sent to the patient's primary care provider via mail/fax/In Basket message after today's visit.    2. Bone health:  She was given education on specific activities to promote bone health.  3. Cancer screening:  Due to Ms. Tienda's history and her age, she should receive screening for skin cancers, colon cancer, and gynecologic cancers.  The information and recommendations are listed on the patient's comprehensive care plan/treatment summary and were reviewed in detail with the patient.    4. Health maintenance and wellness promotion: Ms. Westhoff was encouraged to consume 5-7 servings of fruits and vegetables per day. We reviewed the "Nutrition Rainbow" handout, as well as the handout "Take Control of Your Health and Reduce Your Cancer Risk" from the [employer].  She was  also encouraged to engage in moderate to vigorous exercise for 30 minutes per day most days of the week. We discussed the [employer] fitness program, which is designed for cancer survivors to help them become more physically fit after cancer treatments.  She was instructed to limit her alcohol consumption and continue to abstain from tobacco use.     5. Support services/counseling: It is not uncommon for this period of the patient's cancer care trajectory to be one of many emotions and stressors.  We discussed how this can be increasingly difficult during the times of quarantine and social distancing due to the COVID-19 pandemic.   She was given information regarding our available services and encouraged to contact me with any questions or for help enrolling in any of our support group/programs.    Follow up instructions:    -Return to cancer center in 6 months for f/u with Dr. Lindi Adie  -Mammogram due in 05/2021 -Follow up with surgery 01/2022 -She is welcome to return back to the Survivorship Clinic at any time; no additional  follow-up needed at this time.  -Consider referral back to survivorship as a long-term survivor for continued surveillance  The patient was provided an opportunity to ask questions and all were answered. The patient agreed with the plan and demonstrated an understanding of the instructions.   Total encounter time: 30 minutes* in SCP preparation, face to face visit time, chart review, order entry, care coordination, and documentation of the encounter.   Wilber Bihari, NP 01/07/21 1:16 PM Medical Oncology and Hematology [facility], [place] [address] Tel. [phone]    Fax. [phone]  *Total Encounter Time as defined by the Centers for Medicare and Medicaid Services includes, in addition to the face-to-face time of a patient visit (documented in the note above) non-face-to-face time: obtaining and reviewing outside history, ordering and reviewing medications, tests or procedures, care coordination (communications with other health care professionals or caregivers) and documentation in the medical record.

## 2021-01-08 ENCOUNTER — Encounter: Payer: Self-pay | Admitting: Adult Health

## 2021-01-13 ENCOUNTER — Telehealth: Payer: Self-pay | Admitting: Hematology and Oncology

## 2021-01-13 NOTE — Telephone Encounter (Signed)
Scheduled appointment per 06/30 los. Patient is aware.

## 2021-02-22 DIAGNOSIS — D0511 Intraductal carcinoma in situ of right breast: Secondary | ICD-10-CM | POA: Diagnosis not present

## 2021-03-01 ENCOUNTER — Encounter: Payer: Self-pay | Admitting: Podiatry

## 2021-05-27 DIAGNOSIS — Z23 Encounter for immunization: Secondary | ICD-10-CM | POA: Diagnosis not present

## 2021-05-31 ENCOUNTER — Telehealth: Payer: Self-pay | Admitting: Hematology and Oncology

## 2021-05-31 NOTE — Telephone Encounter (Signed)
Rescheduled appointment per provider. Patient is aware. 

## 2021-07-08 ENCOUNTER — Ambulatory Visit: Payer: BC Managed Care – PPO | Admitting: Hematology and Oncology

## 2021-07-18 NOTE — Assessment & Plan Note (Signed)
08/04/20:Right lumpectomy ([facility]): intermediate grade DCIS, 1.5cm, clear margins.ER 90% weak to moderate staining, PR+ 2%.  Treatment Plan: 1.adjuvant radiation therapy 09/16/20- 10/12/20 2. Followed by antiestrogen therapy  Tamoxifen Toxicities:  Breast Cancer Surveillance: 1. Breast Exam: 07/19/21: Benign 2. Mammogram ordered  RTC in 1 year

## 2021-07-19 ENCOUNTER — Inpatient Hospital Stay: Payer: Managed Care, Other (non HMO) | Attending: Hematology and Oncology | Admitting: Hematology and Oncology

## 2021-07-19 ENCOUNTER — Other Ambulatory Visit: Payer: Self-pay

## 2021-07-19 DIAGNOSIS — Z79810 Long term (current) use of selective estrogen receptor modulators (SERMs): Secondary | ICD-10-CM | POA: Diagnosis not present

## 2021-07-19 DIAGNOSIS — D0511 Intraductal carcinoma in situ of right breast: Secondary | ICD-10-CM | POA: Diagnosis not present

## 2021-07-19 DIAGNOSIS — R61 Generalized hyperhidrosis: Secondary | ICD-10-CM | POA: Insufficient documentation

## 2021-07-19 DIAGNOSIS — Z79899 Other long term (current) drug therapy: Secondary | ICD-10-CM | POA: Diagnosis not present

## 2021-07-19 MED ORDER — TAMOXIFEN CITRATE 20 MG PO TABS: 20.0000 mg | ORAL_TABLET | Freq: Every day | ORAL | 3 refills | Status: DC

## 2021-07-19 NOTE — Progress Notes (Signed)
Patient Care Team: [name], DO as PCP - General (Family Medicine) Erroll Luna, MD as Consulting Physician (General Surgery) Nicholas Lose, MD as Consulting Physician (Hematology and Oncology) Eppie Gibson, MD as Attending Physician (Radiation Oncology)  DIAGNOSIS:  Encounter Diagnosis  Name Primary?   Ductal carcinoma in situ of right breast     SUMMARY OF ONCOLOGIC HISTORY: Oncology History  Ductal carcinoma in situ of right breast  07/14/2020 Initial Diagnosis   Screening mammogram showed upper right breast calcifications, 0.5cm. Biopsy showed high grade DCIS, ER 90% weak to moderate staining, PR+ 2%.   07/24/2020 Genetic Testing   Negative genetic testing:  No pathogenic variants detected on the [facility] Breast Cancer STAT panel or Common Hereditary Cancers panel. A variant of uncertain significance (VUS) was detected in the POLD1 gene called c.202+3A>G (Intronic). The report date is 07/24/2020.  UPDATE:  The POLD1 c.202+3A>G (Intronic) VUS was reclassified to "Likely Benign" on 09/14/2020. The change in variant classification was made as a result of re-review of the evidence in light of new variant interpretation guidelines and/or new information.  The Breast Cancer STAT Panel offered by [facility] includes sequencing and deletion/duplication analysis for the following 9 genes:  ATM, BRCA1, BRCA2, CDH1, CHEK2, PALB2, PTEN, STK11 and TP53. The Common Hereditary Cancers Panel offered by [facility] includes sequencing and/or deletion duplication testing of the following 48 genes: APC, ATM, AXIN2, BARD1, BMPR1A, BRCA1, BRCA2, BRIP1, CDH1, CDK4, CDKN2A (p14ARF), CDKN2A (p16INK4a), CHEK2, CTNNA1, DICER1, EPCAM (Deletion/duplication testing only), GREM1 (promoter region deletion/duplication testing only), KIT, MEN1, MLH1, MSH2, MSH3, MSH6, MUTYH, NBN, NF1, NTHL1, PALB2, PDGFRA, PMS2, POLD1, POLE, PTEN, RAD50, RAD51C, RAD51D, RNF43, SDHB, SDHC, SDHD, SMAD4, SMARCA4. STK11, TP53, TSC1, TSC2, and  VHL.  The following genes were evaluated for sequence changes only: SDHA and HOXB13 c.251G>A variant only.   08/04/2020 Surgery   Right lumpectomy ([facility]): intermediate grade DCIS, 1.5cm, clear margins.   09/15/2020 - 10/12/2020 Radiation Therapy   Site Technique Total Dose (Gy) Dose per Fx (Gy) Completed Fx Beam Energies  Breast, Right: Breast_Rt 3D 40.05/40.05 2.67 15/15 10X  Breast, Right: Breast_Rt_Bst 3D 10/10 2 5/5 6X, 10X     10/2020 -  Anti-estrogen oral therapy   Tamoxifen daily     CHIEF COMPLIANT: Follow-up on tamoxifen therapy  INTERVAL HISTORY: Laura Weiss is a 51 year old with above-mentioned history of right breast DCIS who was treated with lumpectomy and radiation is currently on oral antiestrogen therapy with tamoxifen.  She is tolerating it extremely well without any major problems or concerns.  She does have night sweats which bothered her during the summer but they do not seem to bother her now.  She also started taking tamoxifen in the morning which may have helped.  Denies any lumps or nodules in the breast.   ALLERGIES:  has No Known Allergies.  MEDICATIONS:  Current Outpatient Medications  Medication Sig Dispense Refill   dicyclomine (BENTYL) 20 MG tablet Take 20 mg by mouth 4 (four) times daily as needed for spasms. (Patient not taking: Reported on 09/01/2020)     HYDROcodone-acetaminophen (NORCO/VICODIN) 5-325 MG tablet Take 1 tablet by mouth every 6 (six) hours as needed for moderate pain. (Patient not taking: Reported on 09/01/2020) 15 tablet 0   ibuprofen (ADVIL) 800 MG tablet Take 1 tablet (800 mg total) by mouth every 8 (eight) hours as needed. (Patient not taking: Reported on 09/01/2020) 30 tablet 0   mupirocin ointment (BACTROBAN) 2 % Apply 1 application topically daily as needed (Rash). (  Patient not taking: Reported on 09/01/2020)     OVER THE COUNTER MEDICATION Apply 1 application topically daily as needed (treat acne on face). Benzoyl Peroxide 2.5 %  (Patient not taking: Reported on 09/01/2020)     Polyvinyl Alcohol-Povidone (CLEAR EYES ALL SEASONS) 5-6 MG/ML SOLN Place 1 drop into both eyes daily as needed (Red eyes). (Patient not taking: Reported on 09/01/2020)     tamoxifen (NOLVADEX) 20 MG tablet Take 1 tablet (20 mg total) by mouth daily. 90 tablet 3   No current facility-administered medications for this visit.    PHYSICAL EXAMINATION: ECOG PERFORMANCE STATUS: 1 - Symptomatic but completely ambulatory  Vitals:   07/19/21 1532  BP: (!) 133/91  Pulse: 82  Resp: 18  Temp: (!) 97.5 F (36.4 C)  SpO2: 100%   Filed Weights   07/19/21 1532  Weight: 219 lb 1.6 oz (99.4 kg)    BREAST: No palpable masses or nodules in either right or left breasts. No palpable axillary supraclavicular or infraclavicular adenopathy no breast tenderness or nipple discharge. (exam performed in the presence of a chaperone)  LABORATORY DATA:  I have reviewed the data as listed CMP Latest Ref Rng & Units 07/29/2020  Glucose 70 - 99 mg/dL 106(H)  BUN 6 - 20 mg/dL 10  Creatinine 0.44 - 1.00 mg/dL 0.86  Sodium 135 - 145 mmol/L 138  Potassium 3.5 - 5.1 mmol/L 4.5  Chloride 98 - 111 mmol/L 105  CO2 22 - 32 mmol/L 25  Calcium 8.9 - 10.3 mg/dL 9.7    Lab Results  Component Value Date   WBC 7.2 07/29/2020   HGB 13.1 07/29/2020   HCT 37.5 07/29/2020   MCV 93.8 07/29/2020   PLT 263 07/29/2020    ASSESSMENT & PLAN:  Ductal carcinoma in situ of right breast 08/04/20: Right lumpectomy ([facility]): intermediate grade DCIS, 1.5cm, clear margins.ER 90% weak to moderate staining, PR+ 2%.   Treatment Plan: 1. adjuvant radiation therapy 09/16/20- 10/12/20 2. Followed by antiestrogen therapy started May 2022   Tamoxifen Toxicities: Night sweats: Much improved once she is started taking it in the morning Denies any arthralgias or myalgias.  Breast Cancer Surveillance: 1. Breast Exam: 07/19/21: Benign 2. Mammogram ordered: Recommended that she call the breast  center and get it scheduled.  RTC in 1 year    No orders of the defined types were placed in this encounter.  The patient has a good understanding of the overall plan. she agrees with it. she will call with any problems that may develop before the next visit here. Total time spent: 30 mins including face to face time and time spent for planning, charting and co-ordination of care   Harriette Ohara, MD 07/19/21

## 2021-10-14 ENCOUNTER — Other Ambulatory Visit: Payer: Self-pay | Admitting: *Deleted

## 2021-10-14 MED ORDER — TAMOXIFEN CITRATE 20 MG PO TABS: 20.0000 mg | ORAL_TABLET | Freq: Every day | ORAL | 3 refills | Status: DC

## 2021-11-02 ENCOUNTER — Encounter (HOSPITAL_COMMUNITY): Payer: Self-pay

## 2022-04-29 ENCOUNTER — Other Ambulatory Visit: Payer: Self-pay | Admitting: Hematology and Oncology

## 2022-04-29 DIAGNOSIS — Z1231 Encounter for screening mammogram for malignant neoplasm of breast: Secondary | ICD-10-CM

## 2022-06-21 ENCOUNTER — Ambulatory Visit: Payer: Managed Care, Other (non HMO)

## 2022-06-21 ENCOUNTER — Inpatient Hospital Stay: Admission: RE | Admit: 2022-06-21 | Payer: Managed Care, Other (non HMO) | Source: Ambulatory Visit

## 2022-06-24 ENCOUNTER — Ambulatory Visit: Payer: Managed Care, Other (non HMO)

## 2022-07-06 ENCOUNTER — Telehealth: Payer: Self-pay | Admitting: Hematology and Oncology

## 2022-07-06 NOTE — Telephone Encounter (Signed)
Rescheduled appointment per provider on-call. Patient is aware of the changes made to her upcoming appointment. 

## 2022-07-19 ENCOUNTER — Ambulatory Visit: Payer: Managed Care, Other (non HMO) | Admitting: Hematology and Oncology

## 2022-07-27 ENCOUNTER — Other Ambulatory Visit: Payer: Self-pay | Admitting: Hematology and Oncology

## 2022-07-27 ENCOUNTER — Ambulatory Visit
Admission: RE | Admit: 2022-07-27 | Discharge: 2022-07-27 | Disposition: A | Discharge status: Home / Self Care | Payer: Managed Care, Other (non HMO) | Source: Ambulatory Visit | Attending: Hematology and Oncology | Admitting: Hematology and Oncology

## 2022-07-27 ENCOUNTER — Ambulatory Visit: Payer: Managed Care, Other (non HMO)

## 2022-07-27 DIAGNOSIS — Z853 Personal history of malignant neoplasm of breast: Secondary | ICD-10-CM

## 2022-07-27 DIAGNOSIS — Z1231 Encounter for screening mammogram for malignant neoplasm of breast: Secondary | ICD-10-CM

## 2022-07-27 HISTORY — DX: Personal history of irradiation: Z92.3

## 2022-07-27 HISTORY — DX: Malignant neoplasm of unspecified site of unspecified female breast: C50.919

## 2022-08-08 ENCOUNTER — Inpatient Hospital Stay: Payer: Managed Care, Other (non HMO) | Attending: Hematology and Oncology | Admitting: Hematology and Oncology

## 2022-08-08 ENCOUNTER — Telehealth: Payer: Self-pay | Admitting: Hematology and Oncology

## 2022-08-08 NOTE — Progress Notes (Incomplete)
Patient Care Team: [name], DO as PCP - General (Family Medicine) Erroll Luna, MD as Consulting Physician (General Surgery) Nicholas Lose, MD as Consulting Physician (Hematology and Oncology) Eppie Gibson, MD as Attending Physician (Radiation Oncology)  DIAGNOSIS:  Encounter Diagnosis  Name Primary?   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 Biopsy showed high grade DCIS, ER 90% weak to moderate staining, PR+ 2%.   07/24/2020 Genetic Testing   Negative genetic testing:  No pathogenic variants detected on the [facility] Breast Cancer STAT panel or Common Hereditary Cancers panel. A variant of uncertain significance (VUS) was detected in the POLD1 gene called c.202+3A>G (Intronic). The report date is 07/24/2020.  UPDATE:  The POLD1 c.202+3A>G (Intronic) VUS was reclassified to "Likely Benign" on 09/14/2020. The change in variant classification was made as a result of re-review of the evidence in light of new variant interpretation guidelines and/or new information.  The Breast Cancer STAT Panel offered by [facility] includes sequencing and deletion/duplication analysis for the following 9 genes:  ATM, BRCA1, BRCA2, CDH1, CHEK2, PALB2, PTEN, STK11 and TP53. The Common Hereditary Cancers Panel offered by [facility] includes sequencing and/or deletion duplication testing of the following 48 genes: APC, ATM, AXIN2, BARD1, BMPR1A, BRCA1, BRCA2, BRIP1, CDH1, CDK4, CDKN2A (p14ARF), CDKN2A (p16INK4a), CHEK2, CTNNA1, DICER1, EPCAM (Deletion/duplication testing only), GREM1 (promoter region deletion/duplication testing only), KIT, MEN1, MLH1, MSH2, MSH3, MSH6, MUTYH, NBN, NF1, NTHL1, PALB2, PDGFRA, PMS2, POLD1, POLE, PTEN, RAD50, RAD51C, RAD51D, RNF43, SDHB, SDHC, SDHD, SMAD4, SMARCA4. STK11, TP53, TSC1, TSC2,  and VHL.  The following genes were evaluated for sequence changes only: SDHA and HOXB13 c.251G>A variant only.   08/04/2020 Surgery   Right lumpectomy ([facility]): intermediate grade DCIS, 1.5cm, clear margins.   09/15/2020 - 10/12/2020 Radiation Therapy   Site Technique Total Dose (Gy) Dose per Fx (Gy) Completed Fx Beam Energies  Breast, Right: Breast_Rt 3D 40.05/40.05 2.67 15/15 10X  Breast, Right: Breast_Rt_Bst 3D 10/10 2 5/5 6X, 10X     10/2020 -  Anti-estrogen oral therapy   Tamoxifen daily     CHIEF COMPLIANT: Follow-up on tamoxifen therapy   INTERVAL HISTORY: Laura Weiss is a 52 year old with above-mentioned history of right breast DCIS who was treated with lumpectomy and radiation is currently on oral antiestrogen therapy with tamoxifen. She presents to the clinic for a follow-up.     ALLERGIES:  has No Known Allergies.  MEDICATIONS:  Current Outpatient Medications  Medication Sig Dispense Refill   OVER THE COUNTER MEDICATION Apply 1 application topically daily as needed (treat acne on face). Benzoyl Peroxide 2.5 % (Patient not taking: Reported on 09/01/2020)     Polyvinyl Alcohol-Povidone (CLEAR EYES ALL SEASONS) 5-6 MG/ML SOLN Place 1 drop into both eyes daily as needed (Red eyes). (Patient not taking: Reported on 09/01/2020)     tamoxifen (NOLVADEX) 20 MG tablet Take 1 tablet (20 mg total) by mouth daily. 90 tablet 3   No current facility-administered medications for this visit.    PHYSICAL EXAMINATION: ECOG PERFORMANCE STATUS: {CHL ONC ECOG PS:949 527 1600}  There were no vitals filed for this visit. There were no vitals filed for this visit.  BREAST:*** No palpable masses or nodules in either right or left breasts. No palpable axillary supraclavicular or infraclavicular adenopathy no breast tenderness or nipple discharge. (exam performed in the presence of  a chaperone)  LABORATORY DATA:  I have reviewed the data as listed    Latest Ref Rng & Units 07/29/2020    2:24  PM  CMP  Glucose 70 - 99 mg/dL 106   BUN 6 - 20 mg/dL 10   Creatinine 0.44 - 1.00 mg/dL 0.86   Sodium 135 - 145 mmol/L 138   Potassium 3.5 - 5.1 mmol/L 4.5   Chloride 98 - 111 mmol/L 105   CO2 22 - 32 mmol/L 25   Calcium 8.9 - 10.3 mg/dL 9.7     Lab Results  Component Value Date   WBC 7.2 07/29/2020   HGB 13.1 07/29/2020   HCT 37.5 07/29/2020   MCV 93.8 07/29/2020   PLT 263 07/29/2020    ASSESSMENT & PLAN:  No problem-specific Assessment & Plan notes found for this encounter.    No orders of the defined types were placed in this encounter.  The patient has a good understanding of the overall plan. she agrees with it. she will call with any problems that may develop before the next visit here. Total time spent: 30 mins including face to face time and time spent for planning, charting and co-ordination of care   Suzzette Righter, [place] 08/08/22    I Gardiner Coins am acting as a Education administrator for [employer]  ***

## 2022-08-08 NOTE — Assessment & Plan Note (Deleted)
08/04/20: Right lumpectomy ([facility]): intermediate grade DCIS, 1.5cm, clear margins.ER 90% weak to moderate staining, PR+ 2%.   Treatment Plan: 1. adjuvant radiation therapy 09/16/20- 10/12/20 2. Followed by antiestrogen therapy started May 2022   Tamoxifen Toxicities: Night sweats: Much improved once she is started taking it in the morning Denies any arthralgias or myalgias.   Breast Cancer Surveillance: 1. Breast Exam: 08/08/2022: Benign 2. Mammogram 07/27/2022: Benign breast density category C  Return to clinic in 1 year for follow-up

## 2022-08-08 NOTE — Telephone Encounter (Signed)
Scheduled appointment per 1/29 scheduling message. Patient is aware.

## 2022-08-29 NOTE — Progress Notes (Signed)
Patient Care Team: [name], DO as PCP - General (Family Medicine) Erroll Luna, MD as Consulting Physician (General Surgery) Nicholas Lose, MD as Consulting Physician (Hematology and Oncology) Eppie Gibson, MD as Attending Physician (Radiation Oncology)  DIAGNOSIS: No diagnosis found.  SUMMARY OF ONCOLOGIC HISTORY: Oncology History  Ductal carcinoma in situ of right breast  07/14/2020 Initial Diagnosis   Screening mammogram showed upper right breast calcifications, 0.5cm. Biopsy showed high grade DCIS, ER 90% weak to moderate staining, PR+ 2%.   07/24/2020 Genetic Testing   Negative genetic testing:  No pathogenic variants detected on the [facility] Breast Cancer STAT panel or Common Hereditary Cancers panel. A variant of uncertain significance (VUS) was detected in the POLD1 gene called c.202+3A>G (Intronic). The report date is 07/24/2020.  UPDATE:  The POLD1 c.202+3A>G (Intronic) VUS was reclassified to "Likely Benign" on 09/14/2020. The change in variant classification was made as a result of re-review of the evidence in light of new variant interpretation guidelines and/or new information.  The Breast Cancer STAT Panel offered by [facility] includes sequencing and deletion/duplication analysis for the following 9 genes:  ATM, BRCA1, BRCA2, CDH1, CHEK2, PALB2, PTEN, STK11 and TP53. The Common Hereditary Cancers Panel offered by [facility] includes sequencing and/or deletion duplication testing of the following 48 genes: APC, ATM, AXIN2, BARD1, BMPR1A, BRCA1, BRCA2, BRIP1, CDH1, CDK4, CDKN2A (p14ARF), CDKN2A (p16INK4a), CHEK2, CTNNA1, DICER1, EPCAM (Deletion/duplication testing only), GREM1 (promoter region deletion/duplication testing only), KIT, MEN1, MLH1, MSH2, MSH3, MSH6, MUTYH, NBN, NF1, NTHL1, PALB2, PDGFRA, PMS2, POLD1, POLE, PTEN, RAD50, RAD51C, RAD51D, RNF43, SDHB, SDHC, SDHD, SMAD4, SMARCA4. STK11, TP53, TSC1, TSC2, and VHL.  The following genes were evaluated for sequence changes  only: SDHA and HOXB13 c.251G>A variant only.   08/04/2020 Surgery   Right lumpectomy ([facility]): intermediate grade DCIS, 1.5cm, clear margins.   09/15/2020 - 10/12/2020 Radiation Therapy   Site Technique Total Dose (Gy) Dose per Fx (Gy) Completed Fx Beam Energies  Breast, Right: Breast_Rt 3D 40.05/40.05 2.67 15/15 10X  Breast, Right: Breast_Rt_Bst 3D 10/10 2 5/5 6X, 10X     10/2020 -  Anti-estrogen oral therapy   Tamoxifen daily     CHIEF COMPLIANT:   INTERVAL HISTORY: MATRACA ROSENAU is a   ALLERGIES:  has No Known Allergies.  MEDICATIONS:  Current Outpatient Medications  Medication Sig Dispense Refill   OVER THE COUNTER MEDICATION Apply 1 application topically daily as needed (treat acne on face). Benzoyl Peroxide 2.5 % (Patient not taking: Reported on 09/01/2020)     Polyvinyl Alcohol-Povidone (CLEAR EYES ALL SEASONS) 5-6 MG/ML SOLN Place 1 drop into both eyes daily as needed (Red eyes). (Patient not taking: Reported on 09/01/2020)     tamoxifen (NOLVADEX) 20 MG tablet Take 1 tablet (20 mg total) by mouth daily. 90 tablet 3   No current facility-administered medications for this visit.    PHYSICAL EXAMINATION: ECOG PERFORMANCE STATUS: {CHL ONC ECOG PS:667 323 2140}  There were no vitals filed for this visit. There were no vitals filed for this visit.  BREAST:*** No palpable masses or nodules in either right or left breasts. No palpable axillary supraclavicular or infraclavicular adenopathy no breast tenderness or nipple discharge. (exam performed in the presence of a chaperone)  LABORATORY DATA:  I have reviewed the data as listed    Latest Ref Rng & Units 07/29/2020    2:24 PM  CMP  Glucose 70 - 99 mg/dL 106   BUN 6 - 20 mg/dL 10   Creatinine 0.44 - 1.00 mg/dL 0.86  Sodium 135 - 145 mmol/L 138   Potassium 3.5 - 5.1 mmol/L 4.5   Chloride 98 - 111 mmol/L 105   CO2 22 - 32 mmol/L 25   Calcium 8.9 - 10.3 mg/dL 9.7     Lab Results  Component Value Date   WBC 7.2  07/29/2020   HGB 13.1 07/29/2020   HCT 37.5 07/29/2020   MCV 93.8 07/29/2020   PLT 263 07/29/2020    ASSESSMENT & PLAN:  No problem-specific Assessment & Plan notes found for this encounter.    No orders of the defined types were placed in this encounter.  The patient has a good understanding of the overall plan. she agrees with it. she will call with any problems that may develop before the next visit here. Total time spent: 30 mins including face to face time and time spent for planning, charting and co-ordination of care   Suzzette Righter, [place] 08/29/22    I Gardiner Coins am acting as a Education administrator for [employer]  ***

## 2022-08-30 ENCOUNTER — Inpatient Hospital Stay: Payer: Managed Care, Other (non HMO) | Attending: Hematology and Oncology | Admitting: Hematology and Oncology

## 2022-08-30 ENCOUNTER — Other Ambulatory Visit: Payer: Self-pay

## 2022-08-30 VITALS — BP 153/96 | HR 92 | Temp 97.2°F | Resp 19 | Wt 212.1 lb

## 2022-08-30 DIAGNOSIS — D0511 Intraductal carcinoma in situ of right breast: Secondary | ICD-10-CM | POA: Insufficient documentation

## 2022-08-30 DIAGNOSIS — Z79899 Other long term (current) drug therapy: Secondary | ICD-10-CM | POA: Insufficient documentation

## 2022-08-30 DIAGNOSIS — Z79810 Long term (current) use of selective estrogen receptor modulators (SERMs): Secondary | ICD-10-CM | POA: Insufficient documentation

## 2022-08-30 DIAGNOSIS — R61 Generalized hyperhidrosis: Secondary | ICD-10-CM | POA: Insufficient documentation

## 2022-08-30 MED ORDER — TAMOXIFEN CITRATE 20 MG PO TABS: 20.0000 mg | ORAL_TABLET | Freq: Every day | ORAL | 3 refills | Status: DC

## 2022-08-30 NOTE — Assessment & Plan Note (Addendum)
08/04/20: Right lumpectomy ([facility]): intermediate grade DCIS, 1.5cm, clear margins.ER 90% weak to moderate staining, PR+ 2%.   Treatment Plan: 1. adjuvant radiation therapy 09/16/20- 10/12/20 2. Followed by antiestrogen therapy started May 2022   Tamoxifen Toxicities: Night sweats: Much improved once she is started taking it in the morning Denies any arthralgias or myalgias.   Breast Cancer Surveillance: 1. Mammogram 07/27/2022: Benign   RTC in 1 year

## 2022-10-17 IMAGING — MG DIGITAL DIAGNOSTIC UNILAT RIGHT W/ CAD
7 series · 8 of 15 positions shown · non-contrast
Comparison: Previous exam(s).

CLINICAL DATA: 49-year-old with a personal history of excisional
biopsy from the RIGHT breast in 3779 demonstrating atypical ductal
hyperplasia and a family history of breast cancer in her mother at
age 22. Recall from screening mammography, calcifications involving
the UPPER RIGHT breast at POSTERIOR depth.

EXAM:
DIGITAL DIAGNOSTIC RIGHT MAMMOGRAM WITH CAD

[R CC]
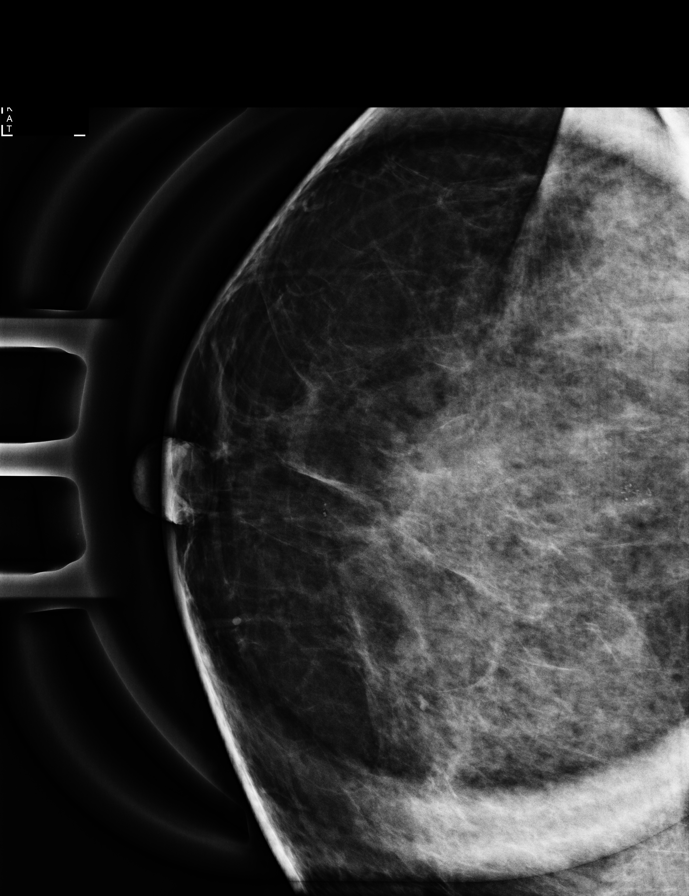

[R ML (1 of 2)]
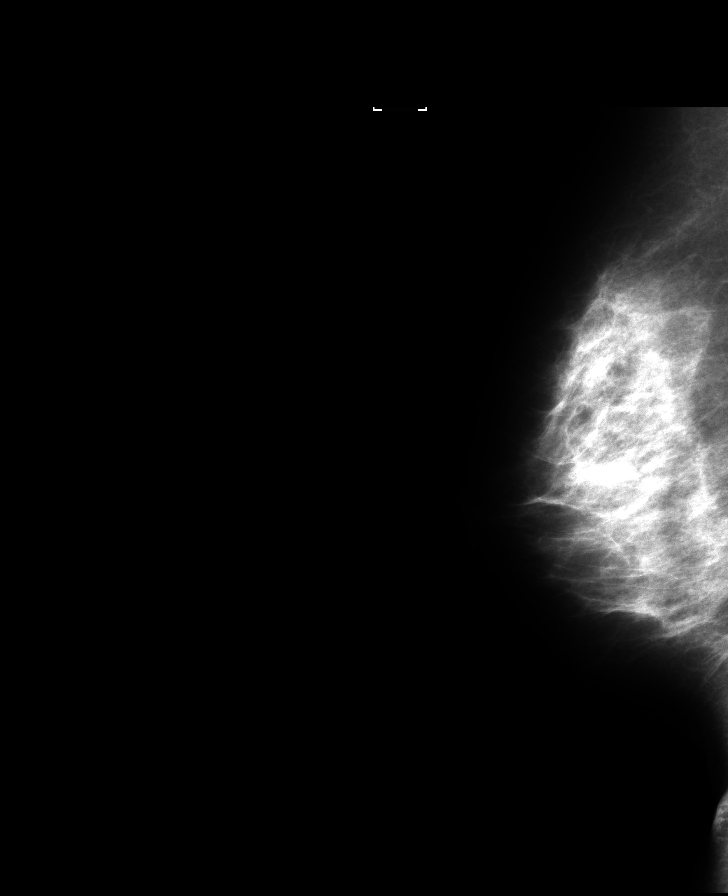

[R ML (2 of 2)]
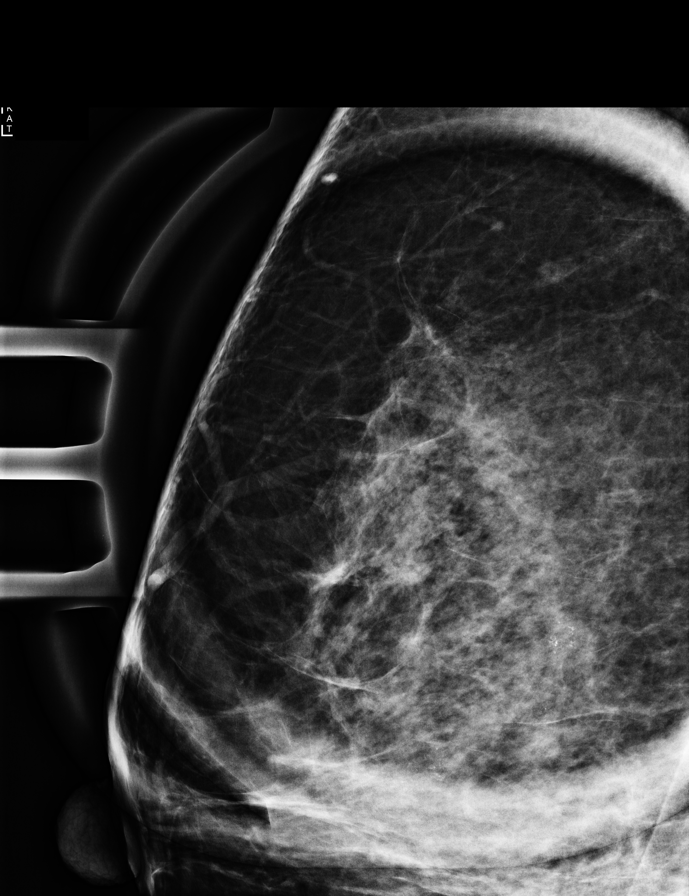

[R ML synth-2D (1 of 2)]
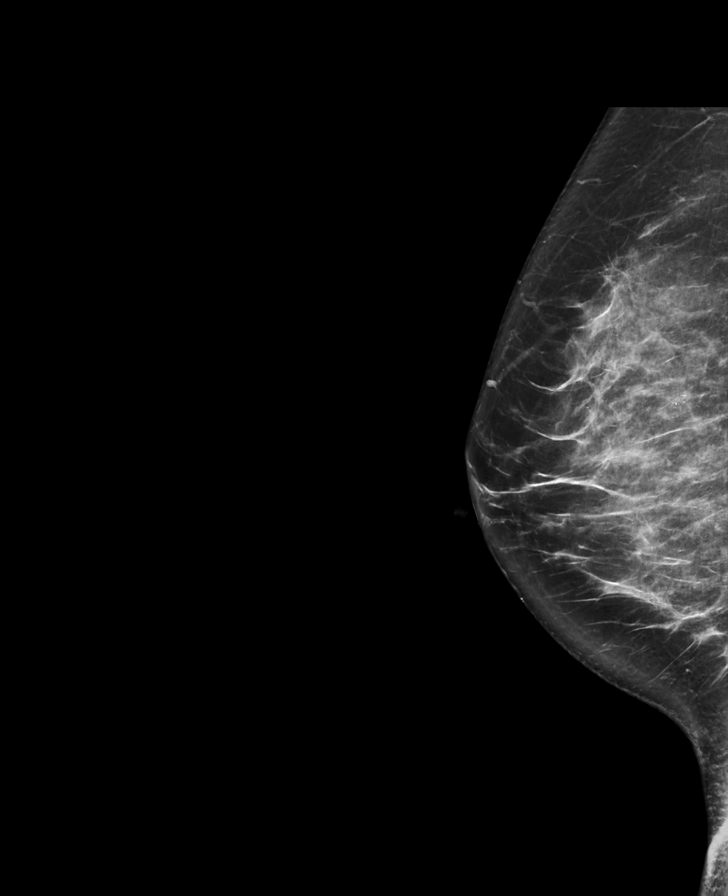

[R ML synth-2D (2 of 2)]
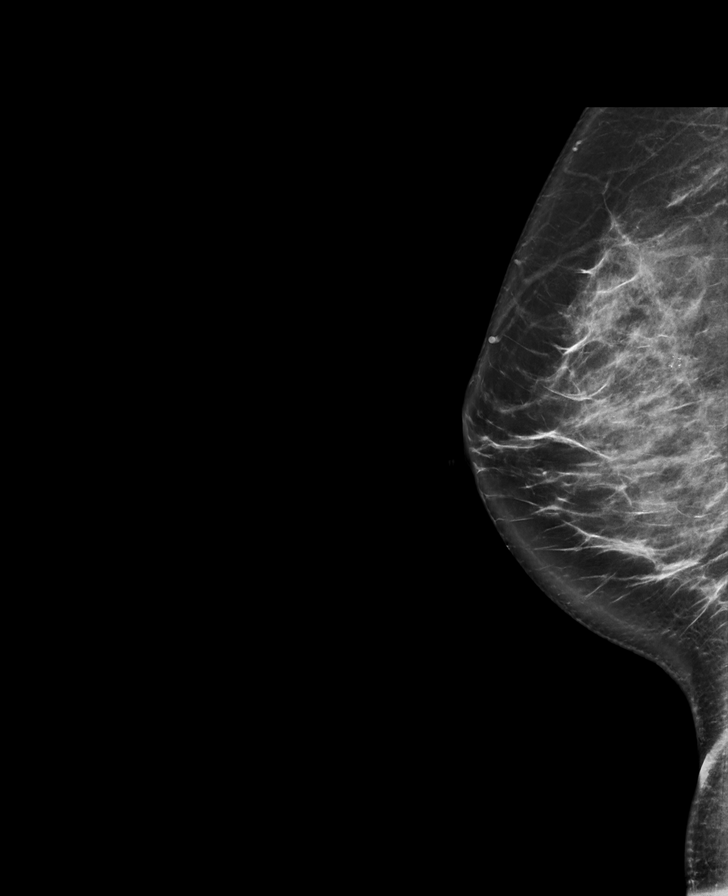

[R ML tomo · 2 of 90 frames shown (1 of 2)]
[frame 29/90]
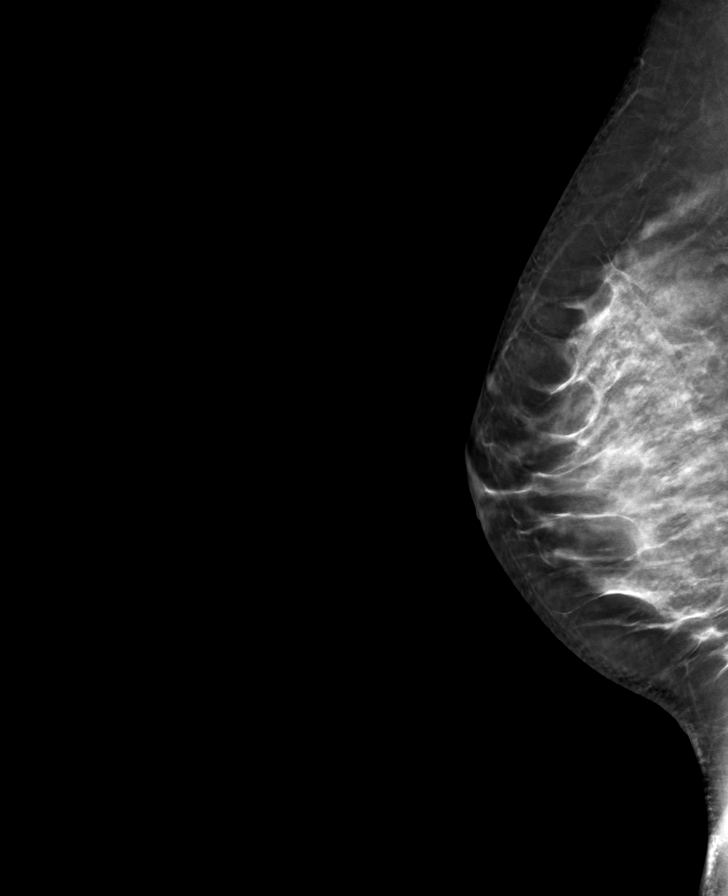
[frame 45/90]
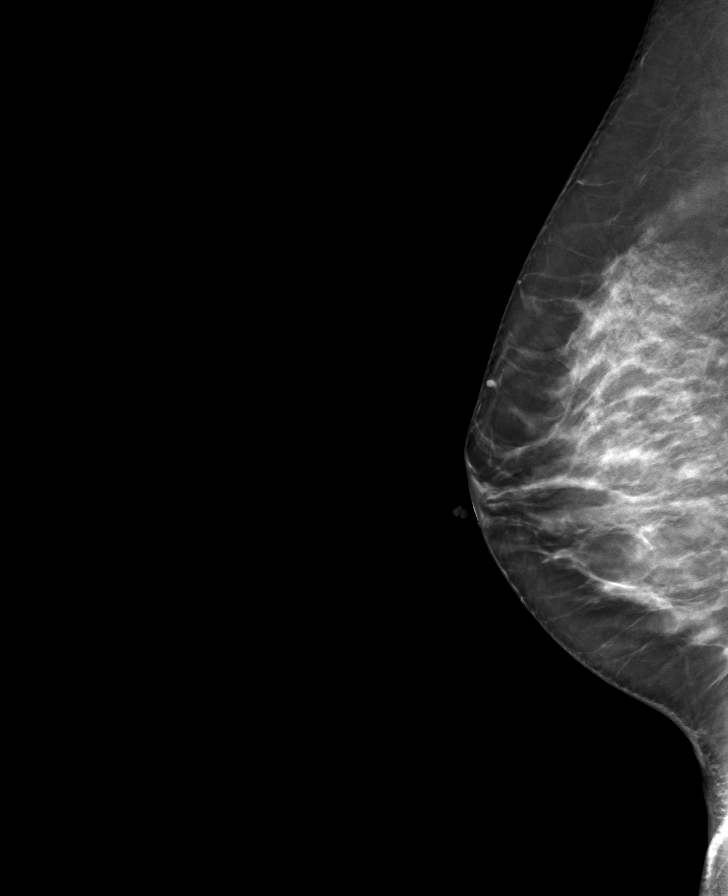

[R ML tomo (2 of 2) · tomo slice 47/94.0]
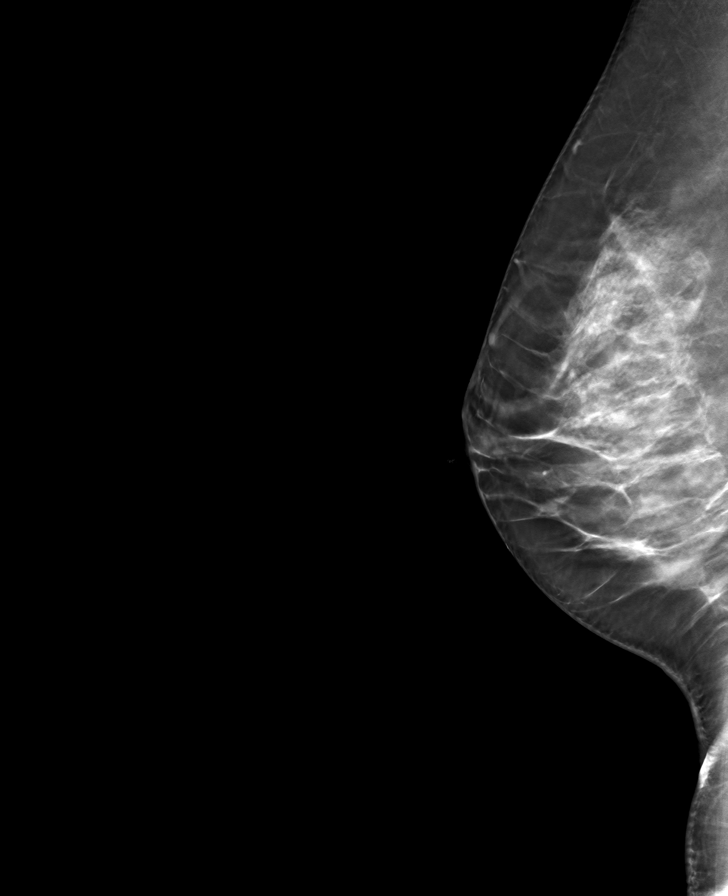

[8 of 15 positions shown; findings below may reference images not displayed]

ACR Breast Density Category c: The breast tissue is heterogeneously
dense, which may obscure small masses.
FINDINGS: Digital 2D spot magnification CC and mediolateral views of the RIGHT
breast calcifications and a digital 2D full field mediolateral view
of the RIGHT breast were obtained. The full field mediolateral image
was processed with CAD.

Spot magnification images confirm a 5 mm group of fine pleomorphic
calcifications in the UPPER breast at POSTERIOR depth.
IMPRESSION: Indeterminate 5 mm group of fine pleomorphic calcifications
involving the UPPER RIGHT breast at POSTERIOR depth.

RECOMMENDATION:
Stereotactic tomosynthesis core needle biopsy of the RIGHT breast
calcifications.

The stereotactic tomosynthesis core needle biopsy procedure was
discussed with the patient and her questions were answered. She
wishes to proceed and the biopsy has been scheduled at her
convenience.

I have discussed the findings and recommendations with the patient.

BI-RADS CATEGORY  4: Suspicious.

## 2022-10-29 IMAGING — MG MM BREAST LOCALIZATION CLIP
4 series · 4 of 12 positions shown · non-contrast
Comparison: Previous exam(s).

CLINICAL DATA: Confirmation of clip placement after stereotactic
tomosynthesis core needle biopsy of calcifications involving the
UPPER RIGHT breast at POSTERIOR depth.

EXAM:
2D AND TOMOSYNTHESIS DIAGNOSTIC RIGHT MAMMOGRAM POST STEREOTACTIC
BIOPSY

[R ML synth-2D]
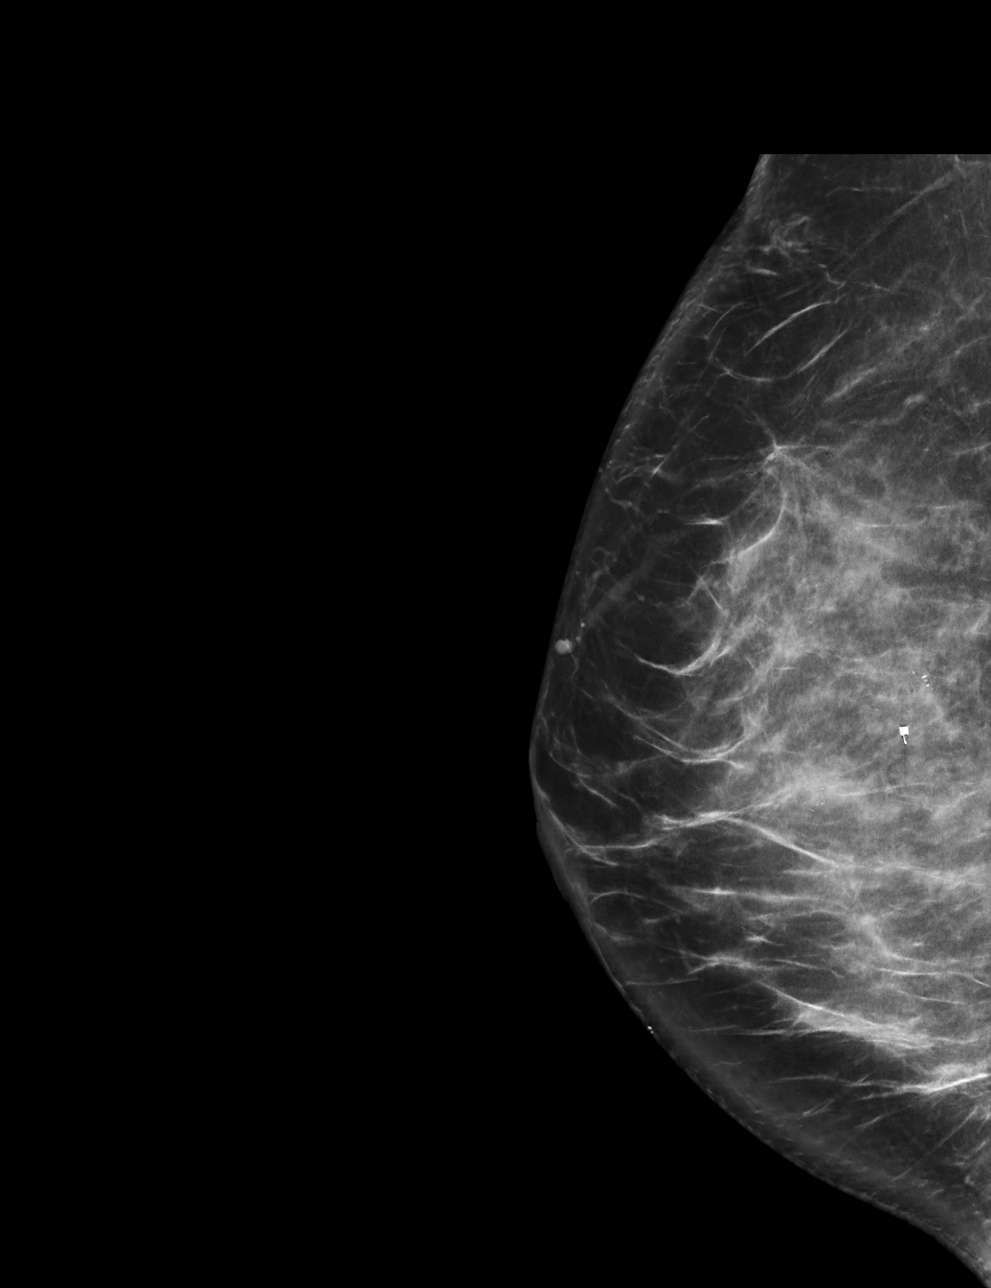

[R CC synth-2D]
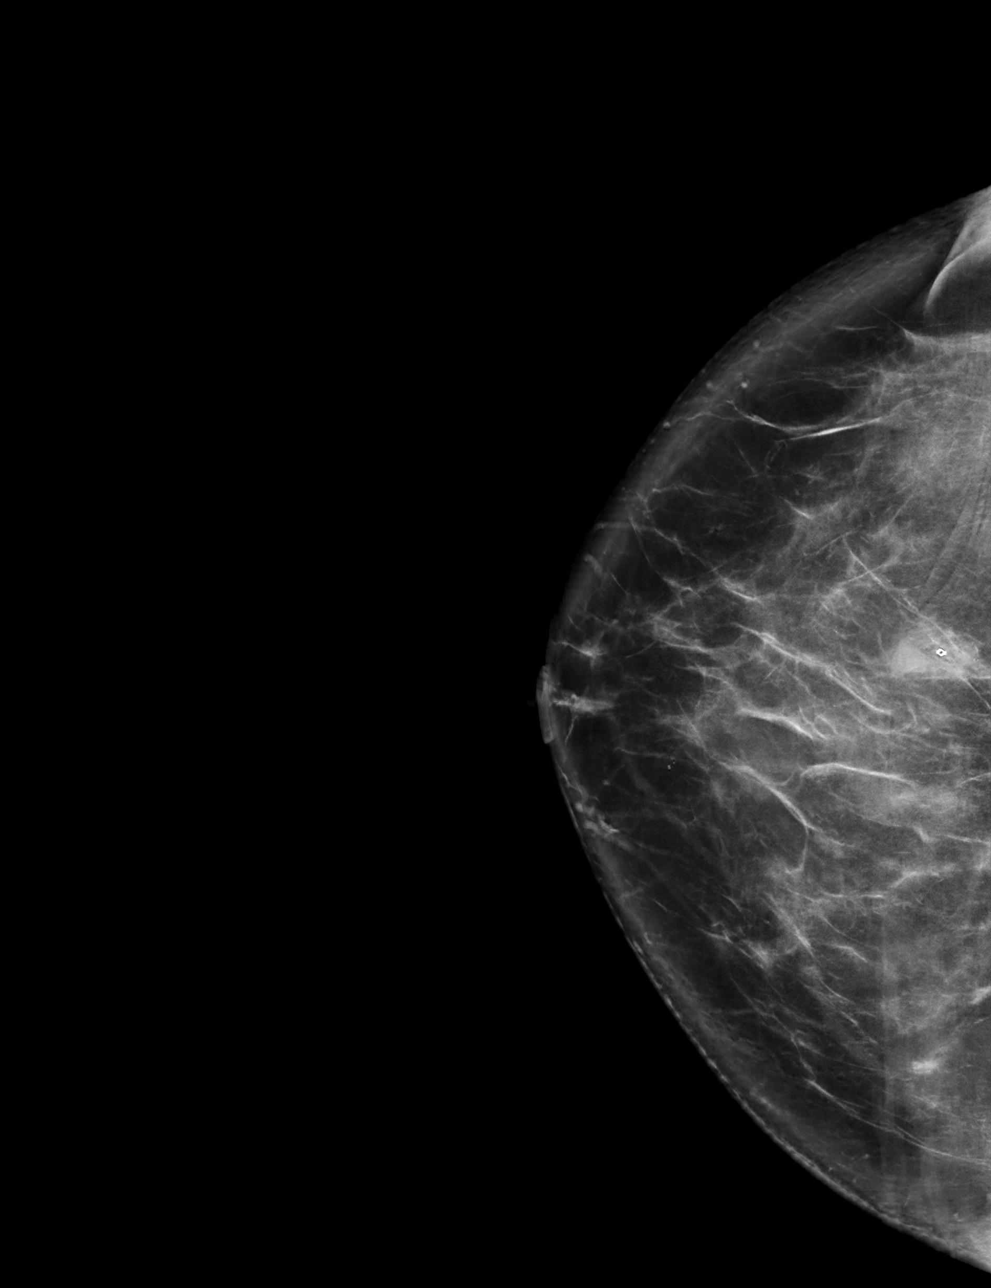

[R ML tomo · tomo slice 41/82.0]
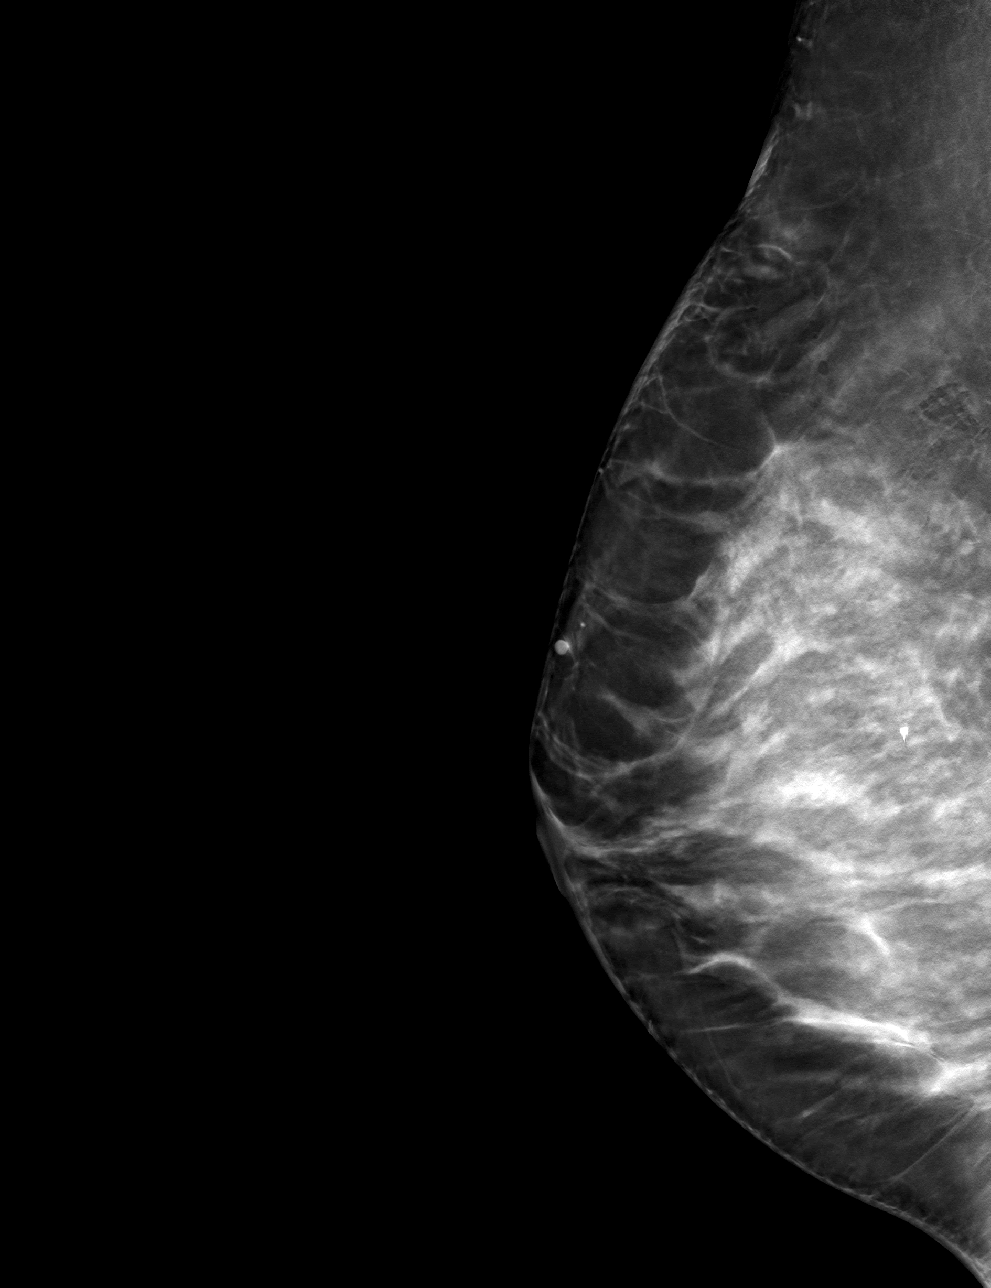

[R CC tomo · tomo slice 43/85.0]
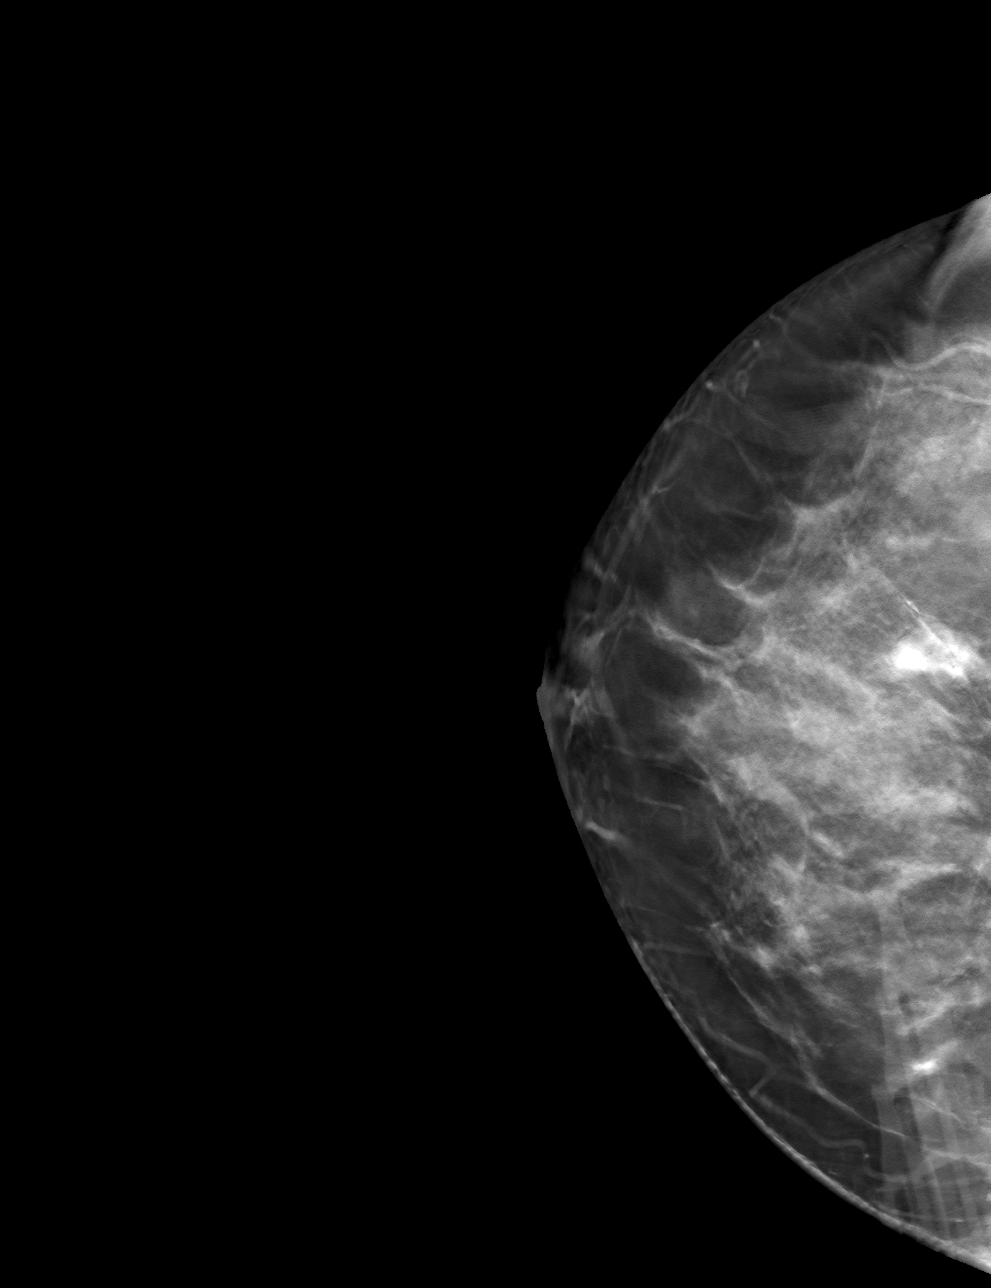

[4 of 12 positions shown; findings below may reference images not displayed]

FINDINGS: Tomosynthesis and synthesized full field CC and mediolateral images
were obtained following stereotactic tomosynthesis guided biopsy of
calcifications involving the UPPER RIGHT breast. The coil shaped
tissue marking clip is appropriately positioned at the site of the
biopsied calcifications in the UPPER breast, slight UPPER OUTER
QUADRANT. There are residual calcifications approximately 7 mm
posterosuperior to the clip.

Expected post biopsy changes are present without evidence of
hematoma.
IMPRESSION: Appropriate positioning of the coil shaped tissue marking clip at
the site of the biopsied calcifications in the UPPER RIGHT breast at
POSTERIOR depth, slight UPPER OUTER QUADRANT. There are residual
calcifications approximately 7 mm posterosuperior to the clip.

Final Assessment: Post Procedure Mammograms for Marker Placement

## 2023-08-28 ENCOUNTER — Telehealth: Payer: Self-pay

## 2023-08-28 ENCOUNTER — Other Ambulatory Visit: Payer: Self-pay | Admitting: Hematology and Oncology

## 2023-08-28 DIAGNOSIS — Z09 Encounter for follow-up examination after completed treatment for conditions other than malignant neoplasm: Secondary | ICD-10-CM

## 2023-08-28 NOTE — Telephone Encounter (Signed)
 Pt called and LVM stating her MM is scheduled for 09/20/23 and asks if she shoud r/s her appt with Dr Pamelia Hoit for after her MM. Returned pt's call to r/s the MD visit. LVM for call back.

## 2023-08-31 ENCOUNTER — Ambulatory Visit: Payer: Managed Care, Other (non HMO) | Admitting: Hematology and Oncology

## 2023-09-01 ENCOUNTER — Other Ambulatory Visit: Payer: Self-pay | Admitting: Hematology and Oncology

## 2023-09-15 ENCOUNTER — Other Ambulatory Visit: Payer: Self-pay | Admitting: Hematology and Oncology

## 2023-09-15 DIAGNOSIS — Z09 Encounter for follow-up examination after completed treatment for conditions other than malignant neoplasm: Secondary | ICD-10-CM

## 2023-09-15 DIAGNOSIS — Z853 Personal history of malignant neoplasm of breast: Secondary | ICD-10-CM

## 2023-09-20 ENCOUNTER — Ambulatory Visit
Admission: RE | Admit: 2023-09-20 | Discharge: 2023-09-20 | Disposition: A | Discharge status: Home / Self Care | Payer: 59 | Source: Ambulatory Visit | Attending: Hematology and Oncology

## 2023-09-20 DIAGNOSIS — Z853 Personal history of malignant neoplasm of breast: Secondary | ICD-10-CM

## 2023-09-27 ENCOUNTER — Inpatient Hospital Stay: Payer: Self-pay | Attending: Hematology and Oncology | Admitting: Hematology and Oncology

## 2023-09-27 VITALS — BP 146/70 | HR 93 | Resp 17 | Ht 68.0 in | Wt 219.2 lb

## 2023-09-27 DIAGNOSIS — Z79899 Other long term (current) drug therapy: Secondary | ICD-10-CM | POA: Insufficient documentation

## 2023-09-27 DIAGNOSIS — R232 Flushing: Secondary | ICD-10-CM | POA: Insufficient documentation

## 2023-09-27 DIAGNOSIS — R61 Generalized hyperhidrosis: Secondary | ICD-10-CM | POA: Insufficient documentation

## 2023-09-27 DIAGNOSIS — Z1721 Progesterone receptor positive status: Secondary | ICD-10-CM | POA: Insufficient documentation

## 2023-09-27 DIAGNOSIS — Z803 Family history of malignant neoplasm of breast: Secondary | ICD-10-CM | POA: Insufficient documentation

## 2023-09-27 DIAGNOSIS — Z170 Estrogen receptor positive status [ER+]: Secondary | ICD-10-CM | POA: Insufficient documentation

## 2023-09-27 DIAGNOSIS — D0511 Intraductal carcinoma in situ of right breast: Secondary | ICD-10-CM | POA: Insufficient documentation

## 2023-09-27 DIAGNOSIS — Z79810 Long term (current) use of selective estrogen receptor modulators (SERMs): Secondary | ICD-10-CM | POA: Insufficient documentation

## 2023-09-27 NOTE — Progress Notes (Signed)
 Patient Care Team: Irena Reichmann, DO as PCP - General (Family Medicine) Harriette Bouillon, MD as Consulting Physician (General Surgery) Serena Croissant, MD as Consulting Physician (Hematology and Oncology) Lonie Peak, MD as Attending Physician (Radiation Oncology)  DIAGNOSIS:  Encounter Diagnosis  Name Primary?   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 Biopsy showed high grade DCIS, ER 90% weak to moderate staining, PR+ 2%.   07/24/2020 Genetic Testing   Negative genetic testing:  No pathogenic variants detected on the [facility] Breast Cancer STAT panel or Common Hereditary Cancers panel. A variant of uncertain significance (VUS) was detected in the POLD1 gene called c.202+3A>G (Intronic). The report date is 07/24/2020.  UPDATE:  The POLD1 c.202+3A>G (Intronic) VUS was reclassified to "Likely Benign" on 09/14/2020. The change in variant classification was made as a result of re-review of the evidence in light of new variant interpretation guidelines and/or new information.  The Breast Cancer STAT Panel offered by [facility] includes sequencing and deletion/duplication analysis for the following 9 genes:  ATM, BRCA1, BRCA2, CDH1, CHEK2, PALB2, PTEN, STK11 and TP53. The Common Hereditary Cancers Panel offered by [facility] includes sequencing and/or deletion duplication testing of the following 48 genes: APC, ATM, AXIN2, BARD1, BMPR1A, BRCA1, BRCA2, BRIP1, CDH1, CDK4, CDKN2A (p14ARF), CDKN2A (p16INK4a), CHEK2, CTNNA1, DICER1, EPCAM (Deletion/duplication testing only), GREM1 (promoter region deletion/duplication testing only), KIT, MEN1, MLH1, MSH2, MSH3, MSH6, MUTYH, NBN, NF1, NTHL1, PALB2, PDGFRA, PMS2, POLD1, POLE, PTEN, RAD50, RAD51C, RAD51D, RNF43, SDHB, SDHC, SDHD, SMAD4, SMARCA4. STK11, TP53, TSC1, TSC2,  and VHL.  The following genes were evaluated for sequence changes only: SDHA and HOXB13 c.251G>A variant only.   08/04/2020 Surgery   Right lumpectomy ([facility]): intermediate grade DCIS, 1.5cm, clear margins.   09/15/2020 - 10/12/2020 Radiation Therapy   Site Technique Total Dose (Gy) Dose per Fx (Gy) Completed Fx Beam Energies  Breast, Right: Breast_Rt 3D 40.05/40.05 2.67 15/15 10X  Breast, Right: Breast_Rt_Bst 3D 10/10 2 5/5 6X, 10X     10/2020 -  Anti-estrogen oral therapy   Tamoxifen daily     CHIEF COMPLIANT: Follow-up on tamoxifen therapy  HISTORY OF PRESENT ILLNESS:  History of Present Illness The patient, with a history of breast cancer diagnosed in 2021, presents for a routine follow-up. She is currently on Tamoxifen, which she tolerates well, despite experiencing hot flashes and night sweats. The patient has tried taking the medication at different times of the day, but it does not seem to affect the side effects. She also reports that her menstrual cycle has stopped since starting the medication. The patient's sister was diagnosed with the same type of breast cancer a year ago, but is not on Tamoxifen due to depression.  The patient recently had a mammogram, which showed extremely dense breasts. She is scheduled for a contrast mammogram in a year due to the density of her breasts.     ALLERGIES:  has no known allergies.  MEDICATIONS:  Current Outpatient Medications  Medication Sig Dispense Refill   OVER THE COUNTER MEDICATION Apply 1 application  topically daily as needed (treat acne on face). Benzoyl Peroxide 2.5 %     Polyvinyl Alcohol-Povidone (CLEAR EYES ALL SEASONS) 5-6 MG/ML SOLN Place 1 drop into both eyes daily as needed (Red eyes).     tamoxifen (NOLVADEX) 20 MG tablet TAKE 1 TABLET  BY MOUTH EVERY DAY 90 tablet 3   No current facility-administered medications for this visit.    PHYSICAL EXAMINATION: ECOG PERFORMANCE STATUS: 1 - Symptomatic but completely  ambulatory  Vitals:   09/27/23 1221  BP: (!) 146/70  Pulse: 93  Resp: 17  SpO2: 100%   Filed Weights   09/27/23 1221  Weight: 219 lb 3.2 oz (99.4 kg)    Physical Exam   (exam performed in the presence of a chaperone)  LABORATORY DATA:  I have reviewed the data as listed    Latest Ref Rng & Units 07/29/2020    2:24 PM  CMP  Glucose 70 - 99 mg/dL 147   BUN 6 - 20 mg/dL 10   Creatinine 8.29 - 1.00 mg/dL 5.62   Sodium 130 - 865 mmol/L 138   Potassium 3.5 - 5.1 mmol/L 4.5   Chloride 98 - 111 mmol/L 105   CO2 22 - 32 mmol/L 25   Calcium 8.9 - 10.3 mg/dL 9.7     Lab Results  Component Value Date   WBC 7.2 07/29/2020   HGB 13.1 07/29/2020   HCT 37.5 07/29/2020   MCV 93.8 07/29/2020   PLT 263 07/29/2020    ASSESSMENT & PLAN:  Ductal carcinoma in situ of right breast 08/04/20: Right lumpectomy ([facility]): intermediate grade DCIS, 1.5cm, clear margins.ER 90% weak to moderate staining, PR+ 2%.   Treatment Plan: 1. adjuvant radiation therapy 09/16/20- 10/12/20 2. Followed by antiestrogen therapy with tamoxifen started May 2022   Tamoxifen Toxicities: Night sweats: Much improved once she is started taking it in the morning Denies any arthralgias or myalgias. I discussed with her that we could reduce the dosage of tamoxifen based upon low-dose tamoxifen data.  However since the tamoxifen had stopped her menstrual cycles she is not keen on reducing the dosage even though she gets significant hot flashes and night sweats.   Breast Cancer Surveillance: 1. Mammogram 09/20/2023: Benign, breast density category D (I recommended contrast-enhanced mammograms from next year onwards.) 2. breast exam 09/27/2023: Benign   her sister was diagnosed with DCIS and her genetic testing was also negative. RTC in 1 year   Orders Placed This Encounter  Procedures   MM 2D DIAG BILAT WITH CONTRAST BCG ONLY    Standing Status:   Future    Expected Date:   09/20/2024    Expiration Date:    09/26/2024    Reason for Exam (SYMPTOM  OR DIAGNOSIS REQUIRED):   High density breast    If indicated for the ordered procedure, I authorize the administration of contrast media per Radiology protocol:   Yes    Does the patient have a contrast media/X-ray dye allergy?:   No    Is the patient pregnant?:   No    Preferred [facility]    Release to patient:   Immediate   The patient has a good understanding of the overall plan. she agrees with it. she will call with any problems that may develop before the next visit here. Total time spent: 30 mins including face to face time and time spent for planning, charting and co-ordination of care   Tamsen Meek, MD 09/27/23

## 2023-09-27 NOTE — Assessment & Plan Note (Signed)
 08/04/20: Right lumpectomy ([facility]): intermediate grade DCIS, 1.5cm, clear margins.ER 90% weak to moderate staining, PR+ 2%.   Treatment Plan: 1. adjuvant radiation therapy 09/16/20- 10/12/20 2. Followed by antiestrogen therapy with tamoxifen started May 2022   Tamoxifen Toxicities: Night sweats: Much improved once she is started taking it in the morning Denies any arthralgias or myalgias.   Breast Cancer Surveillance: 1. Mammogram 09/20/2023: Benign, breast density category D (I recommended contrast-enhanced mammograms from next year onwards. 2. breast exam 09/27/2023: Benign   She tells me that her sister was diagnosed with DCIS and her genetic testing was also negative. RTC in 1 year

## 2024-09-26 ENCOUNTER — Inpatient Hospital Stay: Payer: Self-pay | Admitting: Hematology and Oncology
